# Patient Record
Sex: Female | Born: 1965 | Race: Black or African American | Hispanic: No | Marital: Single | Smoking: Never smoker
Health system: Southern US, Community
[De-identification: ages and names within clinical notes are randomized; demographics above are authoritative.]

## PROBLEM LIST (undated history)

## (undated) DIAGNOSIS — M1990 Unspecified osteoarthritis, unspecified site: Secondary | ICD-10-CM

## (undated) DIAGNOSIS — I10 Essential (primary) hypertension: Secondary | ICD-10-CM

## (undated) DIAGNOSIS — E119 Type 2 diabetes mellitus without complications: Secondary | ICD-10-CM

## (undated) DIAGNOSIS — F32A Depression, unspecified: Secondary | ICD-10-CM

## (undated) DIAGNOSIS — F419 Anxiety disorder, unspecified: Secondary | ICD-10-CM

---

## 2011-01-11 ENCOUNTER — Emergency Department (HOSPITAL_BASED_OUTPATIENT_CLINIC_OR_DEPARTMENT_OTHER)
Admission: EM | Admit: 2011-01-11 | Discharge: 2011-01-12 | Disposition: A | Discharge status: Home / Self Care | Payer: PRIVATE HEALTH INSURANCE | Attending: Emergency Medicine | Admitting: Emergency Medicine

## 2011-01-11 DIAGNOSIS — R112 Nausea with vomiting, unspecified: Secondary | ICD-10-CM | POA: Insufficient documentation

## 2011-01-11 DIAGNOSIS — G8929 Other chronic pain: Secondary | ICD-10-CM | POA: Insufficient documentation

## 2011-01-11 DIAGNOSIS — K5289 Other specified noninfective gastroenteritis and colitis: Secondary | ICD-10-CM | POA: Insufficient documentation

## 2011-01-11 LAB — DIFFERENTIAL
Basophils Absolute: 0 10*3/uL (ref 0.0–0.1)
Eosinophils Relative: 0 % (ref 0–5)
Lymphs Abs: 1.5 10*3/uL (ref 0.7–4.0)
Monocytes Absolute: 0.8 10*3/uL (ref 0.1–1.0)
Neutrophils Relative %: 77 % (ref 43–77)

## 2011-01-11 LAB — CBC
MCH: 27.9 pg (ref 26.0–34.0)
MCHC: 32.8 g/dL (ref 30.0–36.0)
MCV: 85.2 fL (ref 78.0–100.0)
Platelets: 228 10*3/uL (ref 150–400)
RBC: 4.26 MIL/uL (ref 3.87–5.11)
RDW: 13.5 % (ref 11.5–15.5)

## 2011-01-11 LAB — COMPREHENSIVE METABOLIC PANEL
AST: 37 U/L (ref 0–37)
Albumin: 3.8 g/dL (ref 3.5–5.2)
BUN: 10 mg/dL (ref 6–23)
Calcium: 9.6 mg/dL (ref 8.4–10.5)
Chloride: 96 mEq/L (ref 96–112)
Creatinine, Ser: 0.6 mg/dL (ref 0.4–1.2)
Total Protein: 7.7 g/dL (ref 6.0–8.3)

## 2011-01-11 LAB — URINALYSIS, ROUTINE W REFLEX MICROSCOPIC
Glucose, UA: NEGATIVE mg/dL
Ketones, ur: NEGATIVE mg/dL
Leukocytes, UA: NEGATIVE
Nitrite: NEGATIVE
Specific Gravity, Urine: 1.022 (ref 1.005–1.030)
pH: 7 (ref 5.0–8.0)

## 2011-01-11 LAB — URINE MICROSCOPIC-ADD ON

## 2011-01-11 LAB — LIPASE, BLOOD: Lipase: 21 U/L (ref 11–59)

## 2011-01-11 LAB — PREGNANCY, URINE: Preg Test, Ur: NEGATIVE

## 2011-01-13 LAB — URINE CULTURE: Culture: NO GROWTH

## 2020-01-19 DIAGNOSIS — G4733 Obstructive sleep apnea (adult) (pediatric): Secondary | ICD-10-CM | POA: Insufficient documentation

## 2020-01-19 DIAGNOSIS — R002 Palpitations: Secondary | ICD-10-CM | POA: Insufficient documentation

## 2020-01-19 DIAGNOSIS — R609 Edema, unspecified: Secondary | ICD-10-CM | POA: Insufficient documentation

## 2021-02-13 ENCOUNTER — Emergency Department (HOSPITAL_BASED_OUTPATIENT_CLINIC_OR_DEPARTMENT_OTHER)
Admission: EM | Admit: 2021-02-13 | Discharge: 2021-02-14 | Disposition: A | Discharge status: Home / Self Care | Payer: No Typology Code available for payment source | Attending: Emergency Medicine | Admitting: Emergency Medicine

## 2021-02-13 ENCOUNTER — Other Ambulatory Visit: Payer: Self-pay

## 2021-02-13 ENCOUNTER — Encounter (HOSPITAL_BASED_OUTPATIENT_CLINIC_OR_DEPARTMENT_OTHER): Payer: Self-pay | Admitting: *Deleted

## 2021-02-13 DIAGNOSIS — I10 Essential (primary) hypertension: Secondary | ICD-10-CM | POA: Insufficient documentation

## 2021-02-13 DIAGNOSIS — Z7984 Long term (current) use of oral hypoglycemic drugs: Secondary | ICD-10-CM | POA: Diagnosis not present

## 2021-02-13 DIAGNOSIS — F419 Anxiety disorder, unspecified: Secondary | ICD-10-CM | POA: Diagnosis not present

## 2021-02-13 DIAGNOSIS — Z79899 Other long term (current) drug therapy: Secondary | ICD-10-CM | POA: Insufficient documentation

## 2021-02-13 DIAGNOSIS — E119 Type 2 diabetes mellitus without complications: Secondary | ICD-10-CM | POA: Diagnosis not present

## 2021-02-13 DIAGNOSIS — F13239 Sedative, hypnotic or anxiolytic dependence with withdrawal, unspecified: Secondary | ICD-10-CM | POA: Insufficient documentation

## 2021-02-13 DIAGNOSIS — R0602 Shortness of breath: Secondary | ICD-10-CM | POA: Diagnosis present

## 2021-02-13 DIAGNOSIS — F13939 Sedative, hypnotic or anxiolytic use, unspecified with withdrawal, unspecified: Secondary | ICD-10-CM

## 2021-02-13 HISTORY — DX: Depression, unspecified: F32.A

## 2021-02-13 HISTORY — DX: Essential (primary) hypertension: I10

## 2021-02-13 HISTORY — DX: Type 2 diabetes mellitus without complications: E11.9

## 2021-02-13 HISTORY — DX: Anxiety disorder, unspecified: F41.9

## 2021-02-13 NOTE — ED Triage Notes (Addendum)
Sob all day. O2 sats 100%. Pt is ambulatory. States she ran out of her Paxil and Ativan.

## 2021-02-14 MED ORDER — LORAZEPAM 1 MG PO TABS: 1.0000 mg | ORAL_TABLET | Freq: Three times a day (TID) | ORAL | 0 refills | Status: AC | PRN

## 2021-02-14 MED ORDER — PAROXETINE HCL 30 MG PO TABS: 30.0000 mg | ORAL_TABLET | Freq: Every day | ORAL | 0 refills | Status: DC

## 2021-02-14 MED ORDER — LORAZEPAM 1 MG PO TABS
1.0000 mg | ORAL_TABLET | Freq: Once | ORAL | Status: AC
  Administered 2021-02-14: 1 mg via ORAL
  Filled 2021-02-14: qty 1

## 2021-02-14 NOTE — ED Provider Notes (Signed)
[facility] Provider Note: Amy Dell, MD, FACEP  CSN: [id number] MRN: [id number] ARRIVAL: 02/13/21 at 1957 ROOM: MH10/MH10   CHIEF COMPLAINT  Shortness of Breath   HISTORY OF PRESENT ILLNESS  02/14/21 1:01 AM Amy Boyle is a 55 y.o. female who is been out of her Paxil and Ativan for about a week.  She usually takes Ativan about twice a day as needed for anxiety.  She is here with 1 day of shortness of breath.  Symptoms have been moderate.  She has not been wheezing and her symptoms were not improved with albuterol.  She states she feels anxious and somewhat agitated.  She has been pacing around a lot.  She has not had any cold symptoms.  She has not been coughing.   Past Medical History:  Diagnosis Date   Anxiety    Depression    Diabetes mellitus without complication (HCC)    Hypertension     History reviewed. No pertinent surgical history.  No family history on file.  Social History   Tobacco Use   Smoking status: Never   Smokeless tobacco: Never  Substance Use Topics   Alcohol use: Never   Drug use: Never    Prior to Admission medications   Medication Sig Start Date End Date Taking? Authorizing Provider  albuterol (VENTOLIN HFA) 108 (90 Base) MCG/ACT inhaler Inhale into the lungs. 04/29/19  Yes [provider]  Dulaglutide (TRULICITY) 1.5 MG/0.5ML SOPN Inject into the skin. 03/23/18  Yes [provider]  ferrous sulfate 325 (65 FE) MG tablet Take 1 tablet by mouth daily. 04/23/19  Yes [provider]  furosemide (LASIX) 40 MG tablet Take by mouth. 11/15/19  Yes [provider]  hydrochlorothiazide (HYDRODIURIL) 25 MG tablet Take 1 tablet by mouth daily. 04/08/18  Yes [provider]  LORazepam (ATIVAN) 1 MG tablet Take 1 tablet (1 mg total) by mouth 3 (three) times daily as needed for anxiety. 02/14/21  Yes Sheryle Vice, MD  metoprolol tartrate (LOPRESSOR) 25 MG tablet Take 1 tablet by mouth 2 (two)  times daily. 01/19/20  Yes [provider]  potassium chloride (KLOR-CON) 10 MEQ tablet Take by mouth. 11/15/19  Yes [provider]  topiramate (TOPAMAX) 100 MG tablet Take by mouth. 03/16/19  Yes [provider]  buPROPion (WELLBUTRIN XL) 300 MG 24 hr tablet Take by mouth.    [provider]  Oxycodone HCl 10 MG TABS Take by mouth.    [provider]  PARoxetine (PAXIL) 30 MG tablet Take 1 tablet (30 mg total) by mouth daily. 02/14/21   Kattia Selley, Jonny Ruiz, MD    Allergies Patient has no known allergies.   REVIEW OF SYSTEMS  Negative except as noted here or in the History of Present Illness.   PHYSICAL EXAMINATION  Initial Vital Signs Blood pressure (!) 150/76, pulse 84, temperature 98.8 F (37.1 C), temperature source Oral, resp. rate 20, height 5\' 6"  (1.676 m), weight (!) 172.4 kg, last menstrual period 01/22/2021, SpO2 100 %.  Examination General: Well-developed, high BMI female in no acute distress; appearance consistent with age of record HENT: normocephalic; atraumatic Eyes: Normal appearance Neck: supple Heart: regular rate and rhythm Lungs: clear to auscultation bilaterally Abdomen: soft; nondistended; nontender; bowel sounds present Extremities: No deformity; full range of motion Neurologic: Awake, alert and oriented; motor function intact in all extremities and symmetric; no facial droop Skin: Warm and dry Psychiatric: Anxious   RESULTS  Summary of this visit's results, reviewed  and interpreted by myself:   EKG Interpretation  Date/Time:    Ventricular Rate:    PR Interval:    QRS Duration:   QT Interval:    QTC Calculation:   R Axis:     Text Interpretation:          Laboratory Studies: No results found for this or any previous visit (from the past 24 hour(s)). Imaging Studies: No results found.  ED COURSE and MDM  Nursing notes, initial and subsequent vitals signs, including pulse oximetry, reviewed and  interpreted by myself.  Vitals:   02/13/21 2006 02/13/21 2054 02/13/21 2226 02/13/21 2356  BP: (!) 150/92  (!) 144/74 (!) 150/76  Pulse: 93 73 79 84  Resp: 20 12 20 20   Temp: 98.8 F (37.1 C)     TempSrc: Oral     SpO2: 97% 100% 100% 100%  Weight:      Height:       Medications  LORazepam (ATIVAN) tablet 1 mg (has no administration in time range)   We will refill the patient's Paxil and partially refill the patient's Ativan pending follow-up with her PCP.  She was advised that we do not routinely prescribe benzodiazepines in the ED.   PROCEDURES  Procedures   ED DIAGNOSES     ICD-10-CM   1. Anxiety  F41.9     2. 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

## 2021-02-24 ENCOUNTER — Encounter (HOSPITAL_BASED_OUTPATIENT_CLINIC_OR_DEPARTMENT_OTHER): Payer: Self-pay | Admitting: Emergency Medicine

## 2021-02-24 ENCOUNTER — Other Ambulatory Visit: Payer: Self-pay

## 2021-02-24 ENCOUNTER — Emergency Department (HOSPITAL_BASED_OUTPATIENT_CLINIC_OR_DEPARTMENT_OTHER): Payer: PRIVATE HEALTH INSURANCE

## 2021-02-24 ENCOUNTER — Emergency Department (HOSPITAL_BASED_OUTPATIENT_CLINIC_OR_DEPARTMENT_OTHER)
Admission: EM | Admit: 2021-02-24 | Discharge: 2021-02-24 | Disposition: A | Discharge status: Home / Self Care | Payer: PRIVATE HEALTH INSURANCE | Attending: Emergency Medicine | Admitting: Emergency Medicine

## 2021-02-24 ENCOUNTER — Emergency Department (HOSPITAL_COMMUNITY): Payer: PRIVATE HEALTH INSURANCE

## 2021-02-24 DIAGNOSIS — R079 Chest pain, unspecified: Secondary | ICD-10-CM | POA: Insufficient documentation

## 2021-02-24 DIAGNOSIS — I10 Essential (primary) hypertension: Secondary | ICD-10-CM | POA: Insufficient documentation

## 2021-02-24 DIAGNOSIS — R112 Nausea with vomiting, unspecified: Secondary | ICD-10-CM | POA: Diagnosis not present

## 2021-02-24 DIAGNOSIS — Z20822 Contact with and (suspected) exposure to covid-19: Secondary | ICD-10-CM | POA: Insufficient documentation

## 2021-02-24 DIAGNOSIS — Z79899 Other long term (current) drug therapy: Secondary | ICD-10-CM | POA: Insufficient documentation

## 2021-02-24 DIAGNOSIS — R197 Diarrhea, unspecified: Secondary | ICD-10-CM | POA: Diagnosis not present

## 2021-02-24 DIAGNOSIS — R0602 Shortness of breath: Secondary | ICD-10-CM | POA: Insufficient documentation

## 2021-02-24 DIAGNOSIS — R1012 Left upper quadrant pain: Secondary | ICD-10-CM | POA: Insufficient documentation

## 2021-02-24 DIAGNOSIS — Z794 Long term (current) use of insulin: Secondary | ICD-10-CM | POA: Insufficient documentation

## 2021-02-24 DIAGNOSIS — E119 Type 2 diabetes mellitus without complications: Secondary | ICD-10-CM | POA: Insufficient documentation

## 2021-02-24 DIAGNOSIS — R1010 Upper abdominal pain, unspecified: Secondary | ICD-10-CM

## 2021-02-24 LAB — TROPONIN I (HIGH SENSITIVITY)
Troponin I (High Sensitivity): 2 ng/L (ref <18)
Troponin I (High Sensitivity): 2 ng/L (ref <18)

## 2021-02-24 LAB — COMPREHENSIVE METABOLIC PANEL
ALT: 13 U/L (ref 0–44)
AST: 17 U/L (ref 15–41)
Albumin: 4 g/dL (ref 3.5–5.0)
Alkaline Phosphatase: 69 U/L (ref 38–126)
Anion gap: 15 (ref 5–15)
BUN: 8 mg/dL (ref 6–20)
CO2: 26 mmol/L (ref 22–32)
Calcium: 9.6 mg/dL (ref 8.9–10.3)
Chloride: 97 mmol/L — ABNORMAL LOW (ref 98–111)
Creatinine, Ser: 0.86 mg/dL (ref 0.44–1.00)
GFR, Estimated: >60 mL/min (ref >60)
Glucose, Bld: 158 mg/dL — ABNORMAL HIGH (ref 70–99)
Potassium: 3.1 mmol/L — ABNORMAL LOW (ref 3.5–5.1)
Sodium: 138 mmol/L (ref 135–145)
Total Bilirubin: 0.4 mg/dL (ref 0.3–1.2)
Total Protein: 8.6 g/dL — ABNORMAL HIGH (ref 6.5–8.1)

## 2021-02-24 LAB — CBC
HCT: 34.7 % — ABNORMAL LOW (ref 36.0–46.0)
Hemoglobin: 10.6 g/dL — ABNORMAL LOW (ref 12.0–15.0)
MCH: 24.3 pg — ABNORMAL LOW (ref 26.0–34.0)
MCHC: 30.5 g/dL (ref 30.0–36.0)
MCV: 79.6 fL — ABNORMAL LOW (ref 80.0–100.0)
Platelets: 352 10*3/uL (ref 150–400)
RBC: 4.36 MIL/uL (ref 3.87–5.11)
RDW: 16.2 % — ABNORMAL HIGH (ref 11.5–15.5)
WBC: 11.8 10*3/uL — ABNORMAL HIGH (ref 4.0–10.5)
nRBC: 0 % (ref 0.0–0.2)

## 2021-02-24 LAB — RESP PANEL BY RT-PCR (FLU A&B, COVID) ARPGX2
Influenza A by PCR: NEGATIVE
Influenza B by PCR: NEGATIVE
SARS Coronavirus 2 by RT PCR: NEGATIVE

## 2021-02-24 LAB — LACTIC ACID, PLASMA: Lactic Acid, Venous: 1.4 mmol/L (ref 0.5–1.9)

## 2021-02-24 LAB — LIPASE, BLOOD: Lipase: 23 U/L (ref 11–51)

## 2021-02-24 MED ORDER — ONDANSETRON HCL 4 MG/2ML IJ SOLN
4.0000 mg | Freq: Once | INTRAMUSCULAR | Status: AC
  Administered 2021-02-24: 4 mg via INTRAVENOUS
  Filled 2021-02-24: qty 2

## 2021-02-24 MED ORDER — HYDROMORPHONE HCL 1 MG/ML IJ SOLN
1.0000 mg | Freq: Once | INTRAMUSCULAR | Status: AC
  Administered 2021-02-24: 1 mg via INTRAVENOUS
  Filled 2021-02-24: qty 1

## 2021-02-24 MED ORDER — ONDANSETRON HCL 4 MG PO TABS: 4.0000 mg | ORAL_TABLET | Freq: Four times a day (QID) | ORAL | 0 refills | Status: DC

## 2021-02-24 MED ORDER — ALUM & MAG HYDROXIDE-SIMETH 200-200-20 MG/5ML PO SUSP
30.0000 mL | Freq: Once | ORAL | Status: AC
  Administered 2021-02-24: 30 mL via ORAL
  Filled 2021-02-24: qty 30

## 2021-02-24 MED ORDER — LIDOCAINE VISCOUS HCL 2 % MT SOLN
15.0000 mL | Freq: Once | OROMUCOSAL | Status: AC
  Administered 2021-02-24: 15 mL via ORAL
  Filled 2021-02-24: qty 15

## 2021-02-24 MED ORDER — HYDROMORPHONE HCL 1 MG/ML IJ SOLN
1.0000 mg | Freq: Once | INTRAMUSCULAR | Status: AC
Start: 2021-02-24 — End: 2021-02-24
  Administered 2021-02-24: 1 mg via INTRAVENOUS
  Filled 2021-02-24: qty 1

## 2021-02-24 MED ORDER — FAMOTIDINE IN NACL 20-0.9 MG/50ML-% IV SOLN
20.0000 mg | Freq: Once | INTRAVENOUS | Status: AC
  Administered 2021-02-24: 20 mg via INTRAVENOUS
  Filled 2021-02-24: qty 50

## 2021-02-24 MED ORDER — IOHEXOL 350 MG/ML SOLN
80.0000 mL | Freq: Once | INTRAVENOUS | Status: AC | PRN
  Administered 2021-02-24: 80 mL via INTRAVENOUS

## 2021-02-24 MED ORDER — SODIUM CHLORIDE 0.9 % IV BOLUS
500.0000 mL | Freq: Once | INTRAVENOUS | Status: AC
  Administered 2021-02-24: 500 mL via INTRAVENOUS

## 2021-02-24 MED ORDER — SODIUM CHLORIDE 0.9 % IV SOLN: Freq: Once | INTRAVENOUS | Status: AC

## 2021-02-24 NOTE — ED Provider Notes (Addendum)
Patient was transferred to [facility] ED from [facility] since CT scanner was unavailable at [facility].  CT imaging obtained today was without significant abnormality.  Patient now feels significantly improved after treatment both here at [facility] and also [facility].  Repeat abdominal exam is benign.  Patient is taking good p.o.  She now desires discharge home.  Importance of close follow-up is stressed.  Strict return precautions given and understood.   Wynetta Fines, MD 02/24/21 1359    Wynetta Fines, MD 02/24/21 [phone]

## 2021-02-24 NOTE — ED Notes (Signed)
Pt, from [facility], c/o LUQ abdominal pain.  Per [facility], family reported chronic pain management and Pt ran out of medications.

## 2021-02-24 NOTE — ED Provider Notes (Signed)
[facility] EMERGENCY DEPARTMENT Provider Note   CSN: [id number] Arrival date & time: 02/24/21  0740     History Chief Complaint  Patient presents with   Abdominal Pain    Amy Boyle is a 55 y.o. female.  The history is provided by the patient.  Abdominal Pain Pain location:  LUQ Pain quality: sharp   Pain radiates to:  Does not radiate Pain severity:  Moderate Onset quality:  Gradual Duration:  1 day Timing:  Constant Progression:  Worsening Chronicity:  New Context: not sick contacts and not trauma   Relieved by:  Nothing Worsened by:  Nothing Ineffective treatments:  None tried Associated symptoms: chest pain, diarrhea, nausea, shortness of breath and vomiting   Associated symptoms: no chills, no cough, no dysuria, no fever, no hematuria and no sore throat       Past Medical History:  Diagnosis Date   Anxiety    Depression    Diabetes mellitus without complication (HCC)    Hypertension     There are no problems to display for this patient.   History reviewed. No pertinent surgical history.   OB History   No obstetric history on file.     No family history on file.  Social History   Tobacco Use   Smoking status: Never   Smokeless tobacco: Never  Substance Use Topics   Alcohol use: Never   Drug use: Never    Home Medications Prior to Admission medications   Medication Sig Start Date End Date Taking? Authorizing Provider  albuterol (VENTOLIN HFA) 108 (90 Base) MCG/ACT inhaler Inhale into the lungs. 04/29/19   [provider]  buPROPion (WELLBUTRIN XL) 300 MG 24 hr tablet Take by mouth.    [provider]  Dulaglutide (TRULICITY) 1.5 MG/0.5ML SOPN Inject into the skin. 03/23/18   [provider]  ferrous sulfate 325 (65 FE) MG tablet Take 1 tablet by mouth daily. 04/23/19   [provider]  furosemide (LASIX) 40 MG tablet Take by mouth. 11/15/19   [provider]  hydrochlorothiazide  (HYDRODIURIL) 25 MG tablet Take 1 tablet by mouth daily. 04/08/18   [provider]  LORazepam (ATIVAN) 1 MG tablet Take 1 tablet (1 mg total) by mouth 3 (three) times daily as needed for anxiety. 02/14/21   Molpus, John, MD  metoprolol tartrate (LOPRESSOR) 25 MG tablet Take 1 tablet by mouth 2 (two) times daily. 01/19/20   [provider]  Oxycodone HCl 10 MG TABS Take by mouth.    [provider]  PARoxetine (PAXIL) 30 MG tablet Take 1 tablet (30 mg total) by mouth daily. 02/14/21   Molpus, John, MD  potassium chloride (KLOR-CON) 10 MEQ tablet Take by mouth. 11/15/19   [provider]  topiramate (TOPAMAX) 100 MG tablet Take by mouth. 03/16/19   [provider]    Allergies    Patient has no known allergies.  Review of Systems   Review of Systems  Constitutional:  Negative for chills and fever.  HENT:  Negative for ear pain and sore throat.   Eyes:  Negative for pain and visual disturbance.  Respiratory:  Positive for shortness of breath. Negative for cough.   Cardiovascular:  Positive for chest pain. Negative for palpitations.  Gastrointestinal:  Positive for abdominal pain, diarrhea, nausea and vomiting.  Genitourinary:  Negative for dysuria and hematuria.  Musculoskeletal:  Negative for arthralgias and back pain.  Skin:  Negative for color change and rash.  Neurological:  Negative for seizures and syncope.  All other systems reviewed and are negative.  Physical Exam Updated Vital Signs BP (!) 158/98 (BP Location: Right Wrist)   Pulse 66   Temp 98.7 F (37.1 C) (Oral)   Resp (!) 22   SpO2 100%   Physical Exam Vitals and nursing note reviewed.  Constitutional:      Appearance: She is well-developed.  HENT:     Head: Normocephalic and atraumatic.  Cardiovascular:     Rate and Rhythm: Normal rate and regular rhythm.     Heart sounds: Normal heart sounds.  Pulmonary:     Effort: Pulmonary effort is normal. No tachypnea.     Breath  sounds: Normal breath sounds.  Abdominal:     General: Bowel sounds are normal.     Palpations: Abdomen is soft.     Tenderness: There is abdominal tenderness in the epigastric area and left upper quadrant.  Musculoskeletal:     Right lower leg: No edema.     Left lower leg: No edema.  Skin:    General: Skin is warm and dry.  Neurological:     General: No focal deficit present.     Mental Status: She is alert and oriented to person, place, and time.  Psychiatric:        Mood and Affect: Mood normal.        Behavior: Behavior normal.    ED Results / Procedures / Treatments   Labs (all labs ordered are listed, but only abnormal results are displayed) Labs Reviewed  COMPREHENSIVE METABOLIC PANEL - Abnormal; Notable for the following components:      Result Value   Potassium 3.1 (*)    Chloride 97 (*)    Glucose, Bld 158 (*)    Total Protein 8.6 (*)    All other components within normal limits  CBC - Abnormal; Notable for the following components:   WBC 11.8 (*)    Hemoglobin 10.6 (*)    HCT 34.7 (*)    MCV 79.6 (*)    MCH 24.3 (*)    RDW 16.2 (*)    All other components within normal limits  RESP PANEL BY RT-PCR (FLU A&B, COVID) ARPGX2  LIPASE, BLOOD  URINALYSIS, ROUTINE W REFLEX MICROSCOPIC  TROPONIN I (HIGH SENSITIVITY)  TROPONIN I (HIGH SENSITIVITY)    EKG None  Radiology No results found.  Procedures Procedures   Medications Ordered in ED Medications  sodium chloride 0.9 % bolus 500 mL (has no administration in time range)  HYDROmorphone (DILAUDID) injection 1 mg (has no administration in time range)  ondansetron (ZOFRAN) injection 4 mg (4 mg Intravenous Given 02/24/21 0818)    ED Course  I have reviewed the triage vital signs and the nursing notes.  Pertinent labs & imaging results that were available during my care of the patient were reviewed by me and considered in my medical decision making (see chart for details).    MDM Rules/Calculators/A&P                            Amy Boyle presents to the emergency department complaining of chest pain, shortness of breath, nausea, upper abdominal pain, and vomiting.  Differential diagnosis includes pneumonia, ACS, pancreatitis, biliary pathology, bowel obstruction.  Less likely would be urologic pathology.  Her white count is slightly elevated, hemoglobin is approximately one-point lower than a year ago.  She will be transferred to [facility] long hospital.  Unfortunately, there is no CT scan available today at [facility]. Final Clinical Impression(s) / ED Diagnoses Final diagnoses:  Pain of upper abdomen  Diarrhea, unspecified type    Rx / DC Orders ED Discharge Orders     None        Koleen Distance, MD 02/24/21 1015

## 2021-02-24 NOTE — ED Notes (Signed)
Report given to Barry Brunner, Press photographer at [facility] ED

## 2021-02-24 NOTE — ED Notes (Signed)
Verbalized understanding discharge instructions, prescriptions, and follow-up. In no acute distress.   

## 2021-02-24 NOTE — ED Notes (Signed)
SPO2 in lobby 97%, HR 75, unable to walk, taken to room 9 in WC.

## 2021-02-24 NOTE — ED Notes (Signed)
Called Patient's daughter, Shona Needles [phone] and updated her on patient's treatment so far and need to transfer her to [facility] ED.

## 2021-02-24 NOTE — ED Triage Notes (Signed)
Pt reports abdominal pain with emesis. PT also reports SHOB x 2 weeks. Per daughter pt takes "maybe 5-6 percocet's per day and she has ran out." Pt reports last taking her percocet 7/21.

## 2021-02-24 NOTE — ED Notes (Signed)
Patient transported to CT 

## 2021-08-13 ENCOUNTER — Encounter (HOSPITAL_BASED_OUTPATIENT_CLINIC_OR_DEPARTMENT_OTHER): Payer: Self-pay | Admitting: *Deleted

## 2021-08-13 ENCOUNTER — Emergency Department (HOSPITAL_BASED_OUTPATIENT_CLINIC_OR_DEPARTMENT_OTHER): Payer: No Typology Code available for payment source

## 2021-08-13 ENCOUNTER — Other Ambulatory Visit: Payer: Self-pay

## 2021-08-13 ENCOUNTER — Emergency Department (HOSPITAL_BASED_OUTPATIENT_CLINIC_OR_DEPARTMENT_OTHER)
Admission: EM | Admit: 2021-08-13 | Discharge: 2021-08-14 | Disposition: A | Discharge status: Home / Self Care | Payer: No Typology Code available for payment source | Attending: Emergency Medicine | Admitting: Emergency Medicine

## 2021-08-13 DIAGNOSIS — R1084 Generalized abdominal pain: Secondary | ICD-10-CM | POA: Diagnosis present

## 2021-08-13 DIAGNOSIS — Z79899 Other long term (current) drug therapy: Secondary | ICD-10-CM | POA: Insufficient documentation

## 2021-08-13 DIAGNOSIS — E119 Type 2 diabetes mellitus without complications: Secondary | ICD-10-CM | POA: Diagnosis not present

## 2021-08-13 DIAGNOSIS — R197 Diarrhea, unspecified: Secondary | ICD-10-CM | POA: Insufficient documentation

## 2021-08-13 DIAGNOSIS — D72829 Elevated white blood cell count, unspecified: Secondary | ICD-10-CM | POA: Insufficient documentation

## 2021-08-13 DIAGNOSIS — R112 Nausea with vomiting, unspecified: Secondary | ICD-10-CM | POA: Insufficient documentation

## 2021-08-13 DIAGNOSIS — R109 Unspecified abdominal pain: Secondary | ICD-10-CM

## 2021-08-13 DIAGNOSIS — Z794 Long term (current) use of insulin: Secondary | ICD-10-CM | POA: Insufficient documentation

## 2021-08-13 DIAGNOSIS — I10 Essential (primary) hypertension: Secondary | ICD-10-CM | POA: Insufficient documentation

## 2021-08-13 LAB — CBG MONITORING, ED: Glucose-Capillary: 156 mg/dL — ABNORMAL HIGH (ref 70–99)

## 2021-08-13 MED ORDER — LACTATED RINGERS IV BOLUS
1000.0000 mL | Freq: Once | INTRAVENOUS | Status: AC
  Administered 2021-08-13: 1000 mL via INTRAVENOUS

## 2021-08-13 MED ORDER — MORPHINE SULFATE (PF) 4 MG/ML IV SOLN
4.0000 mg | Freq: Once | INTRAVENOUS | Status: AC
Start: 2021-08-13 — End: 2021-08-13
  Administered 2021-08-13: 4 mg via INTRAVENOUS
  Filled 2021-08-13: qty 1

## 2021-08-13 MED ORDER — METOCLOPRAMIDE HCL 5 MG/ML IJ SOLN
10.0000 mg | Freq: Once | INTRAMUSCULAR | Status: AC
  Administered 2021-08-13: 10 mg via INTRAVENOUS
  Filled 2021-08-13: qty 2

## 2021-08-13 MED ORDER — MORPHINE SULFATE (PF) 4 MG/ML IV SOLN
4.0000 mg | Freq: Once | INTRAVENOUS | Status: AC
  Administered 2021-08-13: 4 mg via INTRAVENOUS
  Filled 2021-08-13: qty 1

## 2021-08-13 MED ORDER — IOHEXOL 300 MG/ML  SOLN
100.0000 mL | Freq: Once | INTRAMUSCULAR | Status: AC | PRN
  Administered 2021-08-13: 125 mL via INTRAVENOUS

## 2021-08-13 MED ORDER — ONDANSETRON HCL 4 MG/2ML IJ SOLN
4.0000 mg | Freq: Once | INTRAMUSCULAR | Status: AC
Start: 2021-08-13 — End: 2021-08-13
  Administered 2021-08-13: 4 mg via INTRAVENOUS
  Filled 2021-08-13: qty 2

## 2021-08-13 NOTE — ED Notes (Signed)
Patient transported to CT 

## 2021-08-13 NOTE — ED Notes (Addendum)
Unable to obtain labs x 2 sticks.  Pt was just seen at [facility] and had labs done there.  Pt also states she is unable to void

## 2021-08-13 NOTE — ED Notes (Signed)
ED Provider at bedside. 

## 2021-08-13 NOTE — ED Triage Notes (Signed)
Abdominal pain, vomiting and diarrhea since yesterday

## 2021-08-13 NOTE — ED Notes (Signed)
Patient returned from CT

## 2021-08-13 NOTE — ED Provider Notes (Signed)
[facility] EMERGENCY DEPARTMENT Provider Note   CSN: [id number] Arrival date & time: 08/13/21  1559     History  Chief Complaint  Patient presents with   Abdominal Pain    DAYTON STEPHNEY is a 56 y.o. female.  Patient is a 56 year old female with a history of diabetes, hypertension, anxiety, depression who is presenting today with complaint of nausea vomiting and diarrhea.  Patient reports that her symptoms started yesterday.  She has had diffuse abdominal cramping, vomiting and diarrhea.  She has not noticed any fever, cough but chronically has shortness of breath.  Patient does not take any medication at home for nausea or vomiting.  She denies prior abdominal surgeries.  Patient did go to [facility] regional today and had lab work drawn but was never seen or given any medication.  She reports she was still waiting and she did not want a wait any longer and came here instead.  She denies any change in her medications.  She reports this happens to her every few months but does not know why.  The history is provided by the patient and medical records.  Abdominal Pain Pain location:  Generalized Pain quality: aching and cramping   Pain severity:  Moderate Onset quality:  Gradual Duration:  2 days Timing:  Constant Progression:  Waxing and waning Chronicity:  Recurrent Context: not recent illness, not sick contacts and not suspicious food intake       Home Medications Prior to Admission medications   Medication Sig Start Date End Date Taking? Authorizing Provider  ondansetron (ZOFRAN-ODT) 8 MG disintegrating tablet 8mg  ODT q4 hours prn nausea 08/14/21  Yes Delo, Nathaneil Canary, MD  albuterol (VENTOLIN HFA) 108 (90 Base) MCG/ACT inhaler Inhale into the lungs. 04/29/19   [provider]  buPROPion (WELLBUTRIN XL) 300 MG 24 hr tablet Take by mouth.    [provider]  Dulaglutide (TRULICITY) 1.5 0000000 SOPN Inject into the skin. 03/23/18   [provider]  ferrous sulfate 325 (65 FE) MG tablet Take 1 tablet by mouth daily. 04/23/19   [provider]  furosemide (LASIX) 40 MG tablet Take by mouth. 11/15/19   [provider]  hydrochlorothiazide (HYDRODIURIL) 25 MG tablet Take 1 tablet by mouth daily. 04/08/18   [provider]  LORazepam (ATIVAN) 1 MG tablet Take 1 tablet (1 mg total) by mouth 3 (three) times daily as needed for anxiety. 02/14/21   Molpus, John, MD  metoprolol tartrate (LOPRESSOR) 25 MG tablet Take 1 tablet by mouth 2 (two) times daily. 01/19/20   [provider]  ondansetron (ZOFRAN) 4 MG tablet Take 1 tablet (4 mg total) by mouth every 6 (six) hours. 02/24/21   Valarie Merino, MD  Oxycodone HCl 10 MG TABS Take by mouth.    [provider]  PARoxetine (PAXIL) 30 MG tablet Take 1 tablet (30 mg total) by mouth daily. 02/14/21   Molpus, John, MD  potassium chloride (KLOR-CON) 10 MEQ tablet Take by mouth. 11/15/19   [provider]  topiramate (TOPAMAX) 100 MG tablet Take by mouth. 03/16/19   [provider]      Allergies    Patient has no known allergies.    Review of Systems   Review of Systems  Gastrointestinal:  Positive for abdominal pain.   Physical Exam Updated Vital Signs BP (!) 152/57    Pulse 62    Temp 98.1 F (36.7 C) (Oral)    Resp 16  Ht 5\' 6"  (1.676 m)    Wt (!) 172.4 kg    SpO2 97%    BMI 61.35 kg/m  Physical Exam Vitals and nursing note reviewed.  Constitutional:      General: She is not in acute distress.    Appearance: She is well-developed.  HENT:     Head: Normocephalic and atraumatic.     Nose: Nose normal.     Mouth/Throat:     Mouth: Mucous membranes are dry.  Eyes:     Pupils: Pupils are equal, round, and reactive to light.  Cardiovascular:     Rate and Rhythm: Normal rate and regular rhythm.     Heart sounds: Normal heart sounds. No murmur heard.   No friction rub.  Pulmonary:     Effort: Pulmonary effort is normal.      Breath sounds: Normal breath sounds. No wheezing or rales.  Abdominal:     General: Bowel sounds are normal. There is no distension.     Palpations: Abdomen is soft.     Tenderness: There is generalized abdominal tenderness. There is no right CVA tenderness, left CVA tenderness, guarding or rebound.  Musculoskeletal:        General: No tenderness. Normal range of motion.     Right lower leg: No edema.     Left lower leg: No edema.     Comments: No edema  Skin:    General: Skin is warm and dry.     Findings: No rash.  Neurological:     Mental Status: She is alert and oriented to person, place, and time. Mental status is at baseline.     Cranial Nerves: No cranial nerve deficit.  Psychiatric:        Mood and Affect: Mood normal.        Behavior: Behavior normal.    ED Results / Procedures / Treatments   Labs (all labs ordered are listed, but only abnormal results are displayed) Labs Reviewed  URINALYSIS, ROUTINE W REFLEX MICROSCOPIC - Abnormal; Notable for the following components:      Result Value   Hgb urine dipstick TRACE (*)    Ketones, ur 15 (*)    Protein, ur 30 (*)    All other components within normal limits  URINALYSIS, MICROSCOPIC (REFLEX) - Abnormal; Notable for the following components:   Bacteria, UA RARE (*)    All other components within normal limits  CBG MONITORING, ED - Abnormal; Notable for the following components:   Glucose-Capillary 156 (*)    All other components within normal limits    EKG None  Radiology No results found.  Procedures Procedures    Medications Ordered in ED Medications  morphine 4 MG/ML injection 4 mg (4 mg Intravenous Given 08/13/21 2113)  ondansetron (ZOFRAN) injection 4 mg (4 mg Intravenous Given 08/13/21 2112)  lactated ringers bolus 1,000 mL (0 mLs Intravenous Stopped 08/13/21 2214)  metoCLOPramide (REGLAN) injection 10 mg (10 mg Intravenous Given 08/13/21 2335)  morphine 4 MG/ML injection 4 mg (4 mg Intravenous Given  08/13/21 2338)  iohexol (OMNIPAQUE) 300 MG/ML solution 100 mL (125 mLs Intravenous Contrast Given 08/13/21 2244)    ED Course/ Medical Decision Making/ A&P                           Medical Decision Making Amount and/or Complexity of Data Reviewed External Data Reviewed: labs and notes. Labs: ordered. Decision-making details documented in ED Course.  Patient is a 56 year old female presenting today with abdominal pain nausea vomiting and diarrhea.  Patient has mild diffuse abdominal tenderness on exam but no guarding, rebound.  She was seen at [facility] regional earlier today based on my independent evaluation of her external records.  I personally evaluated her lab work that was done at [facility] and today she had a blood sugar of 154, normal creatinine of 0.76 and normal sodium of 139.  Potassium was slightly decreased at 3.2 which seems to be her baseline with normal LFTs and total bilirubin.  Patient's hemoglobin was stable at 11, normal lipase, BNP.  She had a mild leukocytosis of 13.  She had a chest x-ray done at [facility] regional today that showed subtle ill-defined opacity within the medial right lung base which was favored to be atelectasis.  She denies any cough and does have chronic shortness of breath but is satting 100% on room air and low suspicion for pneumonia at this time.  She also had an EKG done at [facility] which showed sinus bradycardia no acute changes from prior EKGs.  Concern for possible GI illness versus colitis versus gastroparesis.  Lower suspicion for diverticulitis, hepatitis, cholecystitis, pancreatitis today.  Low suspicion for atypical ACS.  We will treat patient with fluids, nausea and pain medication and reevaluate. I independently evaluated.  Patient's urine which is negative for UTI and blood sugar is 156.  On reevaluation patient was still having diffuse abdominal pain but no further vomiting.  We will do a CT to further evaluate.  Patient was  given further pain control.  Care was transferred to Dr. Stark Jock.        Final Clinical Impression(s) / ED Diagnoses Final diagnoses:  Acute abdominal pain    Rx / DC Orders ED Discharge Orders          Ordered    ondansetron (ZOFRAN-ODT) 8 MG disintegrating tablet        08/14/21 0147              Blanchie Dessert, MD 08/17/21 0725

## 2021-08-14 LAB — URINALYSIS, ROUTINE W REFLEX MICROSCOPIC
Bilirubin Urine: NEGATIVE
Glucose, UA: NEGATIVE mg/dL
Ketones, ur: 15 mg/dL — AB
Leukocytes,Ua: NEGATIVE
Nitrite: NEGATIVE
Protein, ur: 30 mg/dL — AB
Specific Gravity, Urine: 1.01 (ref 1.005–1.030)
pH: 6 (ref 5.0–8.0)

## 2021-08-14 LAB — URINALYSIS, MICROSCOPIC (REFLEX)

## 2021-08-14 MED ORDER — ONDANSETRON 8 MG PO TBDP: ORAL_TABLET | ORAL | 0 refills | Status: DC

## 2021-08-14 NOTE — Discharge Instructions (Signed)
Take Zofran as prescribed as needed for nausea.  Clear liquid diet for the next 12 hours, then slowly advance to normal as tolerated.  Return to the emergency department if you develop worsening pain, high fever, bloody stools, or other new and concerning symptoms.

## 2021-08-14 NOTE — ED Provider Notes (Signed)
°  Physical Exam  BP (!) 152/57    Pulse 62    Temp 98.1 F (36.7 C) (Oral)    Resp 16    Ht 5\' 6"  (1.676 m)    Wt (!) 172.4 kg    SpO2 97%    BMI 61.35 kg/m   Physical Exam Vitals and nursing note reviewed.  Constitutional:      General: She is not in acute distress.    Appearance: She is well-developed. She is not diaphoretic.  HENT:     Head: Normocephalic and atraumatic.  Cardiovascular:     Rate and Rhythm: Normal rate and regular rhythm.     Heart sounds: No murmur heard.   No friction rub. No gallop.  Pulmonary:     Effort: Pulmonary effort is normal. No respiratory distress.     Breath sounds: Normal breath sounds. No wheezing.  Abdominal:     General: Bowel sounds are normal. There is no distension.     Palpations: Abdomen is soft.     Tenderness: There is no abdominal tenderness.  Musculoskeletal:        General: Normal range of motion.     Cervical back: Normal range of motion and neck supple.  Skin:    General: Skin is warm and dry.  Neurological:     Mental Status: She is alert and oriented to person, place, and time.    Procedures  Procedures  ED Course / MDM    Medical Decision Making  Care assumed from Dr. at shift change.  Patient awaiting results of CT scan.  Patient originally presented to [facility] with abdominal pain and diarrhea, however left prior to being seen due to prolonged wait time.  She has a mild leukocytosis, but laboratory studies are otherwise unremarkable.  CT scan obtained here shows no acute process, but does show possible inflammation of the urinary bladder.  Urinalysis was obtained and does not reflect UTI.  I am uncertain as to the significance of this finding, but patient is not having any tenderness in this area.  I feel as though patient can safely be discharged.  She will be given Zofran and advised to follow-up as needed.  I have witnessed no vomiting or additional diarrhea since assuming care.       [facility], MD 08/14/21 [phone]

## 2022-07-19 IMAGING — DX DG CHEST 1V PORT
1 series · 1 of 1 positions shown · non-contrast
Comparison: November 08, 2019

CLINICAL DATA: Abdominal pain.  Emesis.  Shortness of breath.

EXAM:
PORTABLE CHEST 1 VIEW

[chest ap]
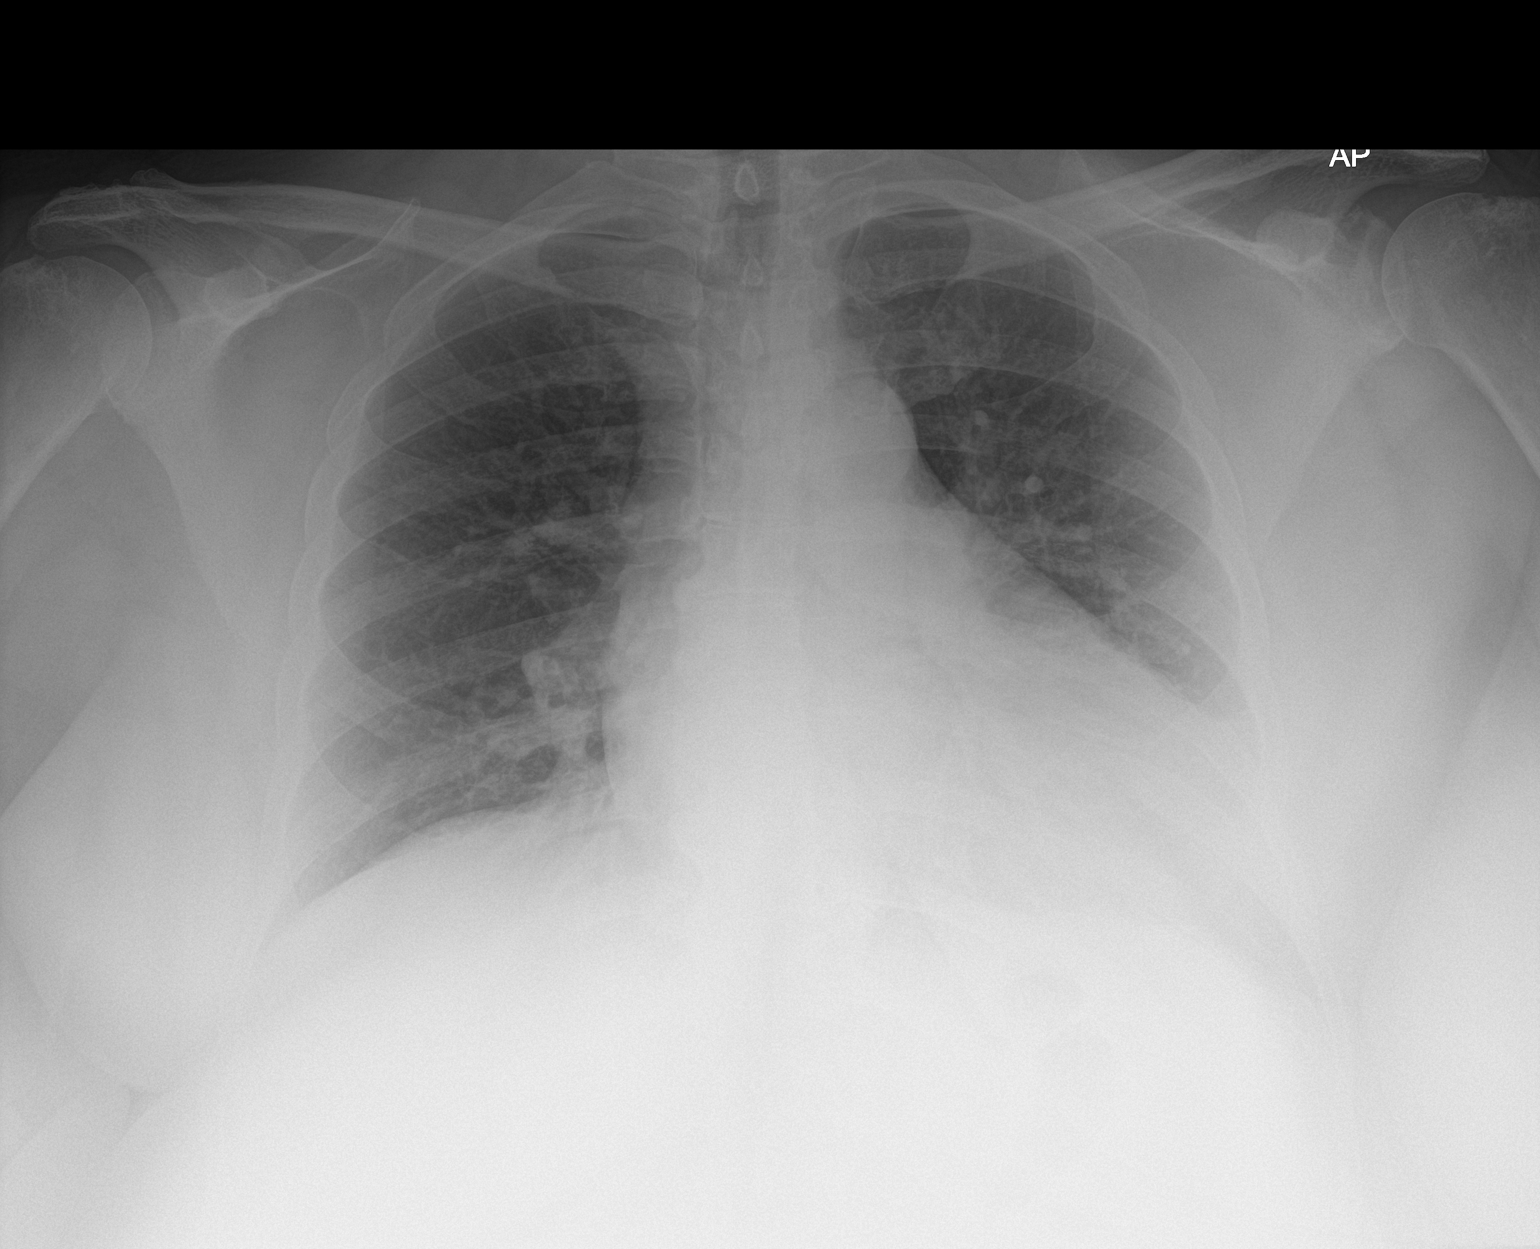

[1 of 1 positions shown; findings below may reference images not displayed]

FINDINGS: The study is limited due to patient body habitus and low volume
portable technique. Mild prominence of the cardiac silhouette. The
hila, mediastinum, lungs, and pleura are otherwise unremarkable.
IMPRESSION: No active disease.

## 2022-07-19 IMAGING — CT CT ABD-PELV W/ CM
2 of 5 series · 16 of 46 positions shown, 18 images · IV contrast (omnipaque)
Comparison: None.

CLINICAL DATA: Bowel obstruction suspected; mid and LEFT upper
quadrant abdominal pain

EXAM:
CT ABDOMEN AND PELVIS WITH CONTRAST
TECHNIQUE: Multidetector CT imaging of the abdomen and pelvis was performed
using the standard protocol following bolus administration of
intravenous contrast.
CONTRAST:  80mL OMNIPAQUE IOHEXOL 350 MG/ML SOLN

[Series 2: axial st · axial · 0.83mm/px · z∈[-447,-42]mm · 13 of 95 slices shown, 15 images]
[im 7/95  soft-tissue]
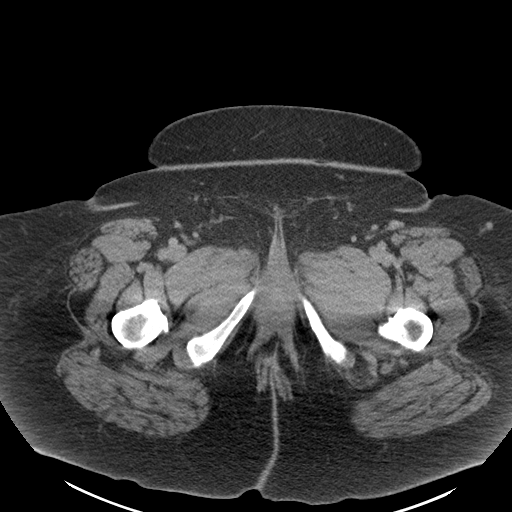
[im 7/95  bone]
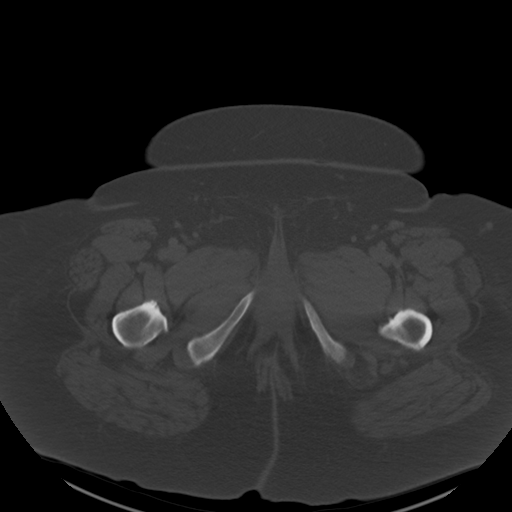
[im 14/95  soft-tissue]
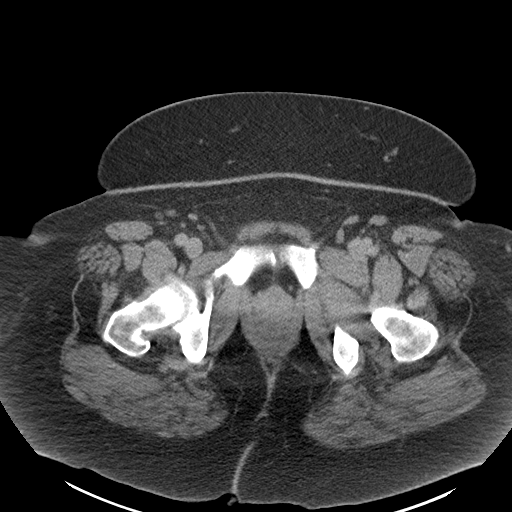
[im 21/95  soft-tissue]
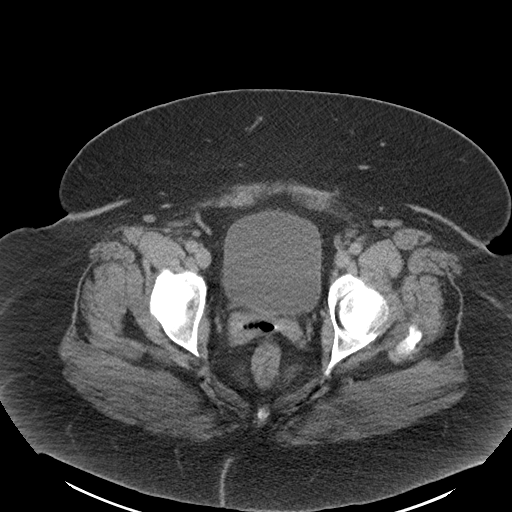
[im 27/95  soft-tissue]
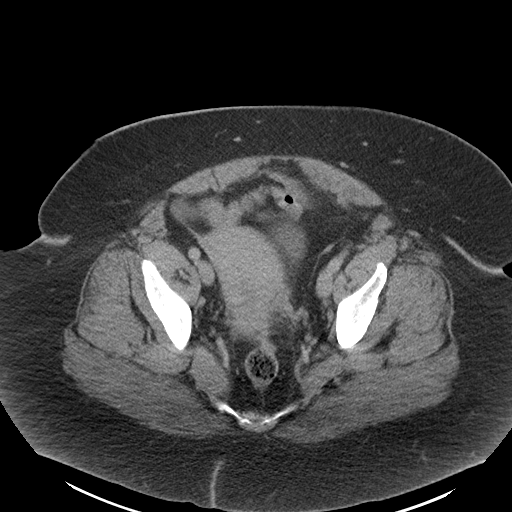
[im 34/95  soft-tissue]
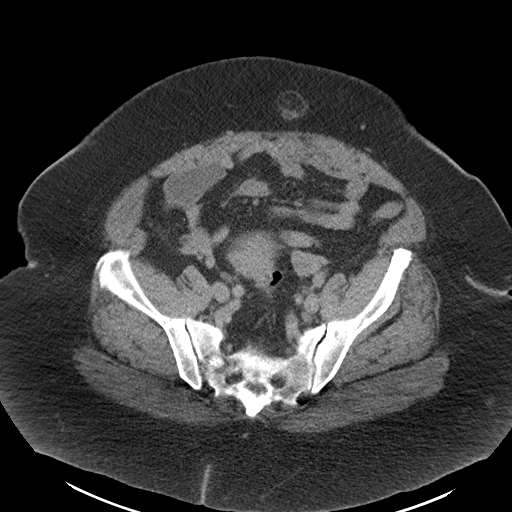
[im 41/95  soft-tissue]
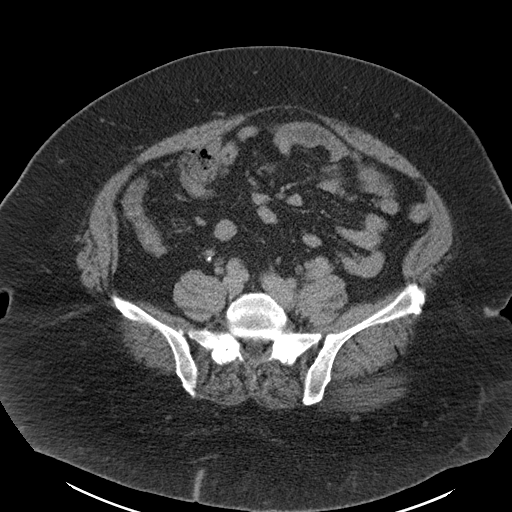
[im 48/95  soft-tissue]
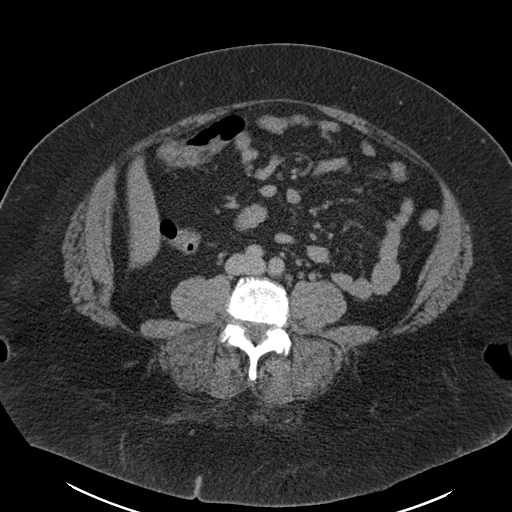
[im 54/95  soft-tissue]
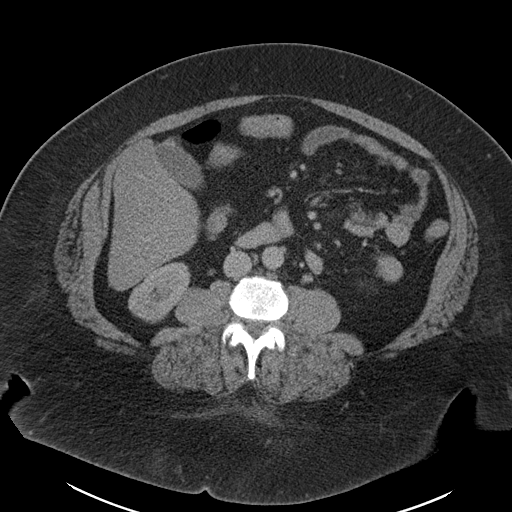
[im 61/95  soft-tissue]
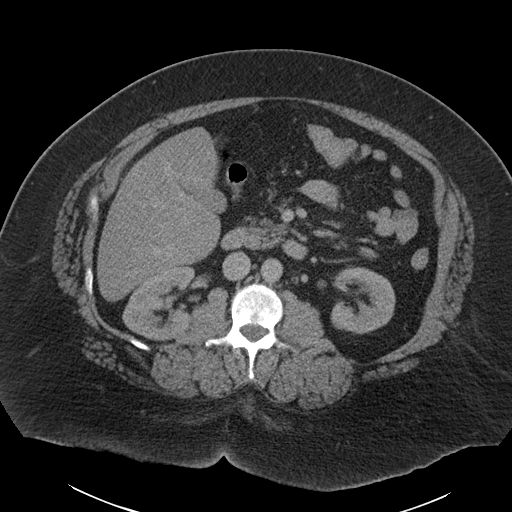
[im 61/95  bone]
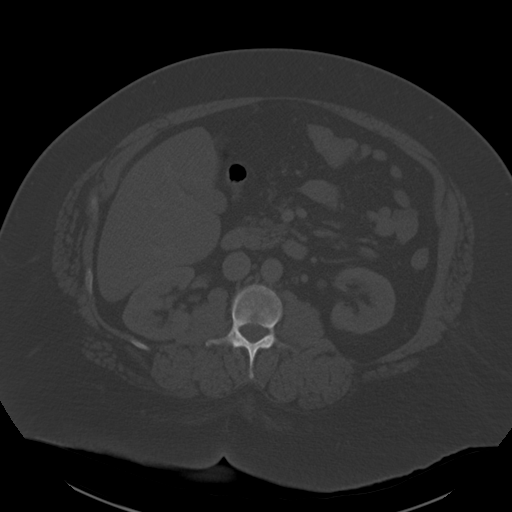
[im 68/95  soft-tissue]
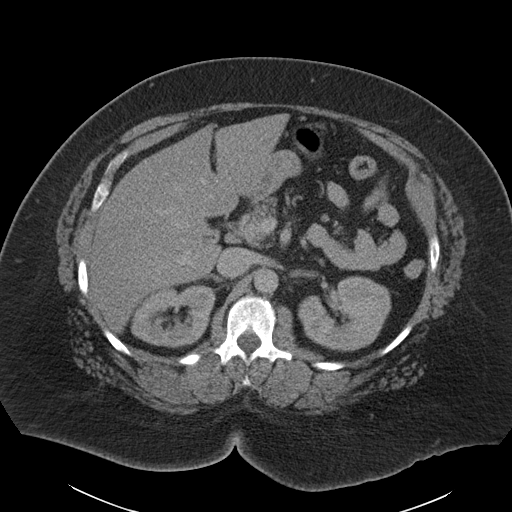
[im 74/95  soft-tissue]
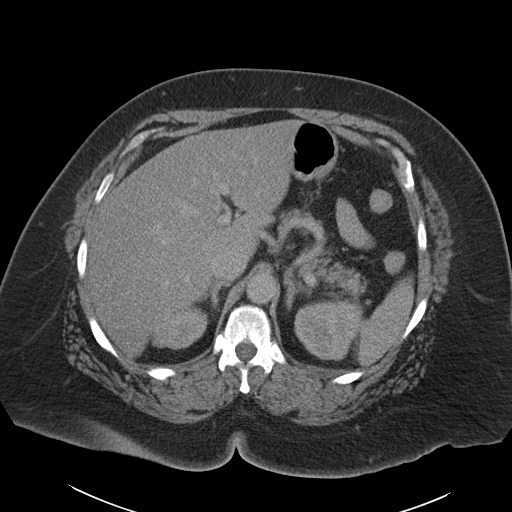
[im 81/95  soft-tissue]
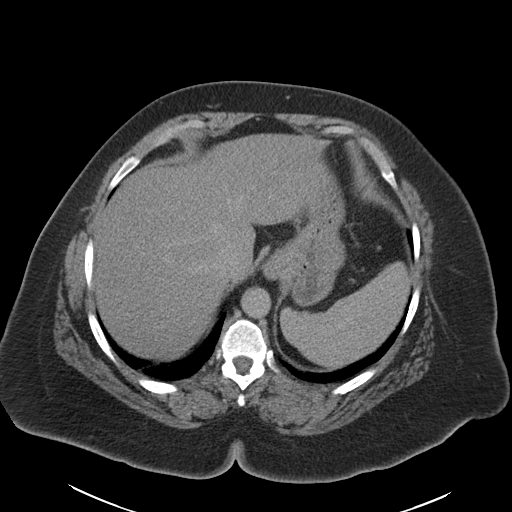
[im 88/95  soft-tissue]
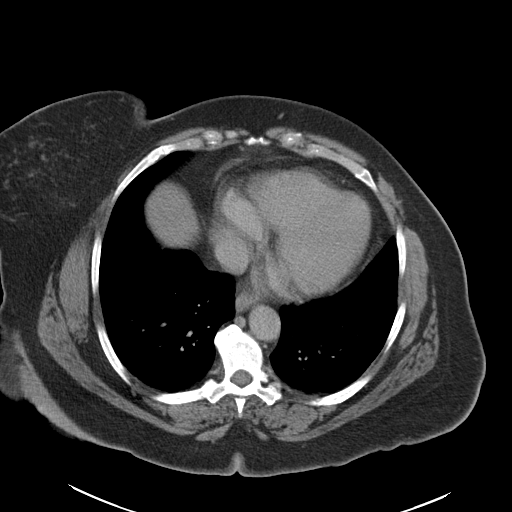

[Series 5: coronal st · coronal · 0.89mm/px · 3 of 191 slices shown]
[im 64/191  soft-tissue]
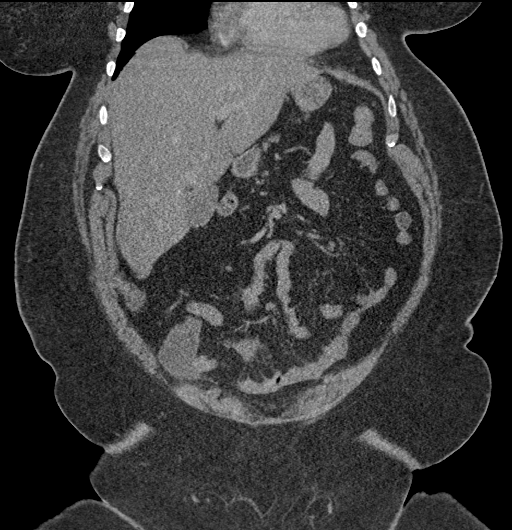
[im 85/191  soft-tissue]
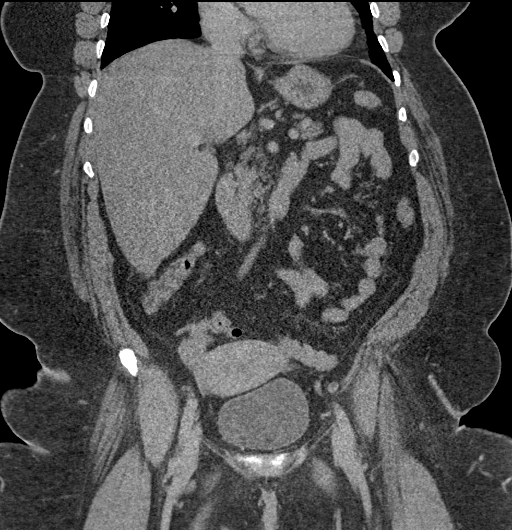
[im 106/191  soft-tissue]
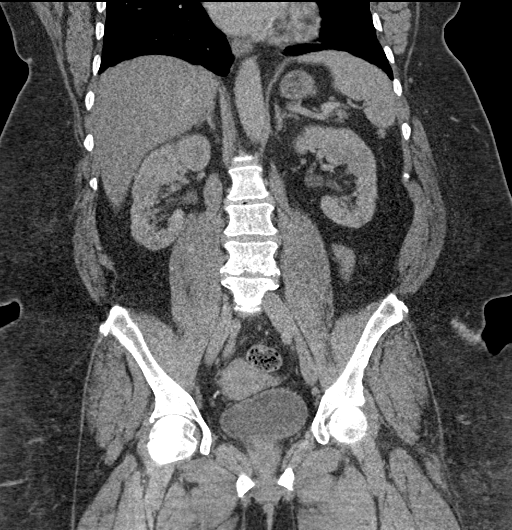

[16 of 46 positions shown; findings below may reference images not displayed]

FINDINGS: Lower chest: No acute abnormality.  Scattered bibasilar atelectasis.

Hepatobiliary: Hepatic steatosis. Focal fatty sparing adjacent to
the gallbladder. Gallbladder is unremarkable. No intrahepatic or
extrahepatic biliary ductal dilation. Portal vein is patent.

Pancreas: Unremarkable. No pancreatic ductal dilatation or
surrounding inflammatory changes.

Spleen: Normal in size without focal abnormality.

Adrenals/Urinary Tract: Adrenal glands are unremarkable. Kidneys are
normal, without renal calculi, focal lesion, or hydronephrosis.
Bladder is unremarkable.

Stomach/Bowel: No evidence of bowel obstruction. Appendix is within
the upper limits of normal in size at 5-6 mm. No ancillary evidence
of acute appendicitis. Small hiatal hernia

Vascular/Lymphatic: No significant vascular findings are present. No
suspicious enlarged abdominal or pelvic lymph nodes. Mildly
prominent RIGHT external iliac lymph node measures 1 cm in the short
axis (series 2, image 72).

Reproductive: Uterus and bilateral adnexa are unremarkable.

Other: Small fat containing paraumbilical hernia. No free air or
free fluid.

Musculoskeletal: Mild degenerative changes of the lumbar spine.
Mixed lucent sclerotic lesion of the RIGHT sacrum, likely a benign
fibro-osseous lesion.
IMPRESSION: 1. There is a small fat containing periumbilical hernia. Otherwise
no suspicious CT etiology for acute epigastric and LEFT upper
quadrant abdominal pain identified.
2. Hepatic steatosis.

## 2022-12-04 ENCOUNTER — Emergency Department (HOSPITAL_BASED_OUTPATIENT_CLINIC_OR_DEPARTMENT_OTHER): Payer: PRIVATE HEALTH INSURANCE

## 2022-12-04 ENCOUNTER — Inpatient Hospital Stay (HOSPITAL_BASED_OUTPATIENT_CLINIC_OR_DEPARTMENT_OTHER)
Admission: EM | Admit: 2022-12-04 | Discharge: 2022-12-07 | DRG: 391 | Disposition: A | Discharge status: Home / Self Care | Payer: PRIVATE HEALTH INSURANCE | Attending: Internal Medicine | Admitting: Internal Medicine

## 2022-12-04 ENCOUNTER — Encounter (HOSPITAL_BASED_OUTPATIENT_CLINIC_OR_DEPARTMENT_OTHER): Payer: Self-pay

## 2022-12-04 DIAGNOSIS — I110 Hypertensive heart disease with heart failure: Secondary | ICD-10-CM | POA: Diagnosis present

## 2022-12-04 DIAGNOSIS — E860 Dehydration: Secondary | ICD-10-CM | POA: Diagnosis present

## 2022-12-04 DIAGNOSIS — N950 Postmenopausal bleeding: Secondary | ICD-10-CM | POA: Diagnosis present

## 2022-12-04 DIAGNOSIS — K2900 Acute gastritis without bleeding: Principal | ICD-10-CM | POA: Diagnosis present

## 2022-12-04 DIAGNOSIS — E1165 Type 2 diabetes mellitus with hyperglycemia: Secondary | ICD-10-CM | POA: Diagnosis present

## 2022-12-04 DIAGNOSIS — R0609 Other forms of dyspnea: Secondary | ICD-10-CM

## 2022-12-04 DIAGNOSIS — R1084 Generalized abdominal pain: Secondary | ICD-10-CM | POA: Diagnosis present

## 2022-12-04 DIAGNOSIS — T383X6A Underdosing of insulin and oral hypoglycemic [antidiabetic] drugs, initial encounter: Secondary | ICD-10-CM | POA: Diagnosis present

## 2022-12-04 DIAGNOSIS — R001 Bradycardia, unspecified: Secondary | ICD-10-CM | POA: Diagnosis present

## 2022-12-04 DIAGNOSIS — F32A Depression, unspecified: Secondary | ICD-10-CM | POA: Diagnosis present

## 2022-12-04 DIAGNOSIS — Z91138 Patient's unintentional underdosing of medication regimen for other reason: Secondary | ICD-10-CM

## 2022-12-04 DIAGNOSIS — D638 Anemia in other chronic diseases classified elsewhere: Secondary | ICD-10-CM | POA: Diagnosis present

## 2022-12-04 DIAGNOSIS — R9389 Abnormal findings on diagnostic imaging of other specified body structures: Secondary | ICD-10-CM | POA: Diagnosis present

## 2022-12-04 DIAGNOSIS — R160 Hepatomegaly, not elsewhere classified: Secondary | ICD-10-CM | POA: Diagnosis present

## 2022-12-04 DIAGNOSIS — E662 Morbid (severe) obesity with alveolar hypoventilation: Secondary | ICD-10-CM | POA: Diagnosis present

## 2022-12-04 DIAGNOSIS — Z6841 Body Mass Index (BMI) 40.0 and over, adult: Secondary | ICD-10-CM

## 2022-12-04 DIAGNOSIS — R0902 Hypoxemia: Secondary | ICD-10-CM | POA: Diagnosis present

## 2022-12-04 DIAGNOSIS — Z7985 Long-term (current) use of injectable non-insulin antidiabetic drugs: Secondary | ICD-10-CM

## 2022-12-04 DIAGNOSIS — Z79899 Other long term (current) drug therapy: Secondary | ICD-10-CM

## 2022-12-04 DIAGNOSIS — R531 Weakness: Secondary | ICD-10-CM | POA: Diagnosis present

## 2022-12-04 DIAGNOSIS — E119 Type 2 diabetes mellitus without complications: Secondary | ICD-10-CM

## 2022-12-04 DIAGNOSIS — T501X6A Underdosing of loop [high-ceiling] diuretics, initial encounter: Secondary | ICD-10-CM | POA: Diagnosis present

## 2022-12-04 DIAGNOSIS — R109 Unspecified abdominal pain: Secondary | ICD-10-CM | POA: Diagnosis present

## 2022-12-04 DIAGNOSIS — I5033 Acute on chronic diastolic (congestive) heart failure: Secondary | ICD-10-CM | POA: Diagnosis not present

## 2022-12-04 DIAGNOSIS — R0600 Dyspnea, unspecified: Secondary | ICD-10-CM | POA: Diagnosis present

## 2022-12-04 DIAGNOSIS — R000 Tachycardia, unspecified: Secondary | ICD-10-CM | POA: Diagnosis present

## 2022-12-04 DIAGNOSIS — K760 Fatty (change of) liver, not elsewhere classified: Secondary | ICD-10-CM | POA: Diagnosis present

## 2022-12-04 DIAGNOSIS — I10 Essential (primary) hypertension: Secondary | ICD-10-CM | POA: Diagnosis present

## 2022-12-04 DIAGNOSIS — T447X6A Underdosing of beta-adrenoreceptor antagonists, initial encounter: Secondary | ICD-10-CM | POA: Diagnosis present

## 2022-12-04 DIAGNOSIS — F419 Anxiety disorder, unspecified: Secondary | ICD-10-CM | POA: Diagnosis present

## 2022-12-04 DIAGNOSIS — Z23 Encounter for immunization: Secondary | ICD-10-CM

## 2022-12-04 HISTORY — DX: Unspecified osteoarthritis, unspecified site: M19.90

## 2022-12-04 HISTORY — DX: Morbid (severe) obesity due to excess calories: E66.01

## 2022-12-04 LAB — COMPREHENSIVE METABOLIC PANEL
ALT: 14 U/L (ref 0–44)
AST: 20 U/L (ref 15–41)
Albumin: 4 g/dL (ref 3.5–5.0)
Alkaline Phosphatase: 66 U/L (ref 38–126)
Anion gap: 13 (ref 5–15)
BUN: 10 mg/dL (ref 6–20)
CO2: 20 mmol/L — ABNORMAL LOW (ref 22–32)
Calcium: 9 mg/dL (ref 8.9–10.3)
Chloride: 102 mmol/L (ref 98–111)
Creatinine, Ser: 0.8 mg/dL (ref 0.44–1.00)
GFR, Estimated: >60 mL/min (ref >60)
Glucose, Bld: 138 mg/dL — ABNORMAL HIGH (ref 70–99)
Potassium: 3.5 mmol/L (ref 3.5–5.1)
Sodium: 135 mmol/L (ref 135–145)
Total Bilirubin: 0.4 mg/dL (ref 0.3–1.2)
Total Protein: 8.2 g/dL — ABNORMAL HIGH (ref 6.5–8.1)

## 2022-12-04 LAB — CBC
HCT: 35 % — ABNORMAL LOW (ref 36.0–46.0)
Hemoglobin: 10.9 g/dL — ABNORMAL LOW (ref 12.0–15.0)
MCH: 25.4 pg — ABNORMAL LOW (ref 26.0–34.0)
MCHC: 31.1 g/dL (ref 30.0–36.0)
MCV: 81.6 fL (ref 80.0–100.0)
Platelets: 265 10*3/uL (ref 150–400)
RBC: 4.29 MIL/uL (ref 3.87–5.11)
RDW: 15.2 % (ref 11.5–15.5)
WBC: 9.1 10*3/uL (ref 4.0–10.5)
nRBC: 0 % (ref 0.0–0.2)

## 2022-12-04 LAB — LIPASE, BLOOD: Lipase: 26 U/L (ref 11–51)

## 2022-12-04 LAB — MAGNESIUM: Magnesium: 2.1 mg/dL (ref 1.7–2.4)

## 2022-12-04 LAB — BRAIN NATRIURETIC PEPTIDE: B Natriuretic Peptide: 47.9 pg/mL (ref 0.0–100.0)

## 2022-12-04 LAB — CBG MONITORING, ED: Glucose-Capillary: 126 mg/dL — ABNORMAL HIGH (ref 70–99)

## 2022-12-04 LAB — TSH: TSH: 1.689 u[IU]/mL (ref 0.350–4.500)

## 2022-12-04 LAB — D-DIMER, QUANTITATIVE: D-Dimer, Quant: 0.6 ug/mL-FEU — ABNORMAL HIGH (ref 0.00–0.50)

## 2022-12-04 MED ORDER — ONDANSETRON HCL 4 MG/2ML IJ SOLN
4.0000 mg | Freq: Once | INTRAMUSCULAR | Status: AC
  Administered 2022-12-04: 4 mg via INTRAVENOUS
  Filled 2022-12-04: qty 2

## 2022-12-04 MED ORDER — FENTANYL CITRATE PF 50 MCG/ML IJ SOSY
50.0000 ug | PREFILLED_SYRINGE | Freq: Once | INTRAMUSCULAR | Status: AC
  Administered 2022-12-04: 50 ug via INTRAVENOUS
  Filled 2022-12-04: qty 1

## 2022-12-04 MED ORDER — FENTANYL CITRATE PF 50 MCG/ML IJ SOSY
50.0000 ug | PREFILLED_SYRINGE | Freq: Once | INTRAMUSCULAR | Status: AC
  Administered 2022-12-04: 50 ug via INTRAMUSCULAR
  Filled 2022-12-04: qty 1

## 2022-12-04 MED ORDER — SODIUM CHLORIDE 0.9 % IV BOLUS
1000.0000 mL | Freq: Once | INTRAVENOUS | Status: AC
  Administered 2022-12-04: 1000 mL via INTRAVENOUS

## 2022-12-04 MED ORDER — IOHEXOL 350 MG/ML SOLN
100.0000 mL | Freq: Once | INTRAVENOUS | Status: AC | PRN
  Administered 2022-12-04: 100 mL via INTRAVENOUS

## 2022-12-04 MED ORDER — METOCLOPRAMIDE HCL 5 MG/ML IJ SOLN
10.0000 mg | Freq: Once | INTRAMUSCULAR | Status: AC
  Administered 2022-12-04: 10 mg via INTRAMUSCULAR
  Filled 2022-12-04: qty 2

## 2022-12-04 MED ORDER — HYDROMORPHONE HCL 1 MG/ML IJ SOLN
1.0000 mg | Freq: Once | INTRAMUSCULAR | Status: AC
  Administered 2022-12-04: 1 mg via INTRAVENOUS
  Filled 2022-12-04: qty 1

## 2022-12-04 MED ORDER — IOHEXOL 300 MG/ML  SOLN
100.0000 mL | Freq: Once | INTRAMUSCULAR | Status: AC | PRN
  Administered 2022-12-04: 125 mL via INTRAVENOUS

## 2022-12-04 NOTE — ED Notes (Signed)
Pt is situated in chair, with legs up, but made sure she understands she must remain on monitor, as she has frequently removed all leads, wires, etc. Pt also used the bathroom on her self and gave needed supplies to change her depends, and removed pants, feels better now.

## 2022-12-04 NOTE — ED Notes (Signed)
US at bedside

## 2022-12-04 NOTE — ED Notes (Addendum)
Ambulated pt down hallway with RN Corrie Dandy, felt SOB, dizzy with spo2 96% and 146HR sustained.  Had to stop halfway, palpated pulse felt very fast, still very symptomatic and turned around to go back to room with an spo2 96-97% and still sustained 145-146 HR.  Upon entering the room, sat in chair, after 30 seconds HR was down to 120's,  then 30 seconds later was down to 110's, with a 106 HR on EKG. Felt very SOB and looked winded. EKG repeated and EDP aware.

## 2022-12-04 NOTE — ED Provider Notes (Signed)
[place] [facility] AT [facility] Provider Note   CSN: [id number] Arrival date & time: 12/04/22  1044     History  Chief Complaint  Patient presents with   Abdominal Pain    Amy Boyle is a 57 y.o. female with a past medical history of DM, HTN, anxiety, depression, who presents emergency department complaining of generalized abdominal pain onset yesterday.  Notes history of similar symptoms.  Does not have a GI specialist at this time.  Has associated nausea, vomiting, soft nonbloody stools.  Also voicing concerns for dyspnea on exertion.  Has a history of congestive heart failure and is compliant with her Lasix.  Notes that she is due to schedule appoint with her cardiologist.  Denies concerns for chest pain at this time.  The history is provided by the patient. No language interpreter was used.       Home Medications Prior to Admission medications   Medication Sig Start Date End Date Taking? Authorizing Provider  albuterol (VENTOLIN HFA) 108 (90 Base) MCG/ACT inhaler Inhale into the lungs. 04/29/19   [provider]  buPROPion (WELLBUTRIN XL) 300 MG 24 hr tablet Take by mouth.    [provider]  Dulaglutide (TRULICITY) 1.5 MG/0.5ML SOPN Inject into the skin. 03/23/18   [provider]  ferrous sulfate 325 (65 FE) MG tablet Take 1 tablet by mouth daily. 04/23/19   [provider]  furosemide (LASIX) 40 MG tablet Take by mouth. 11/15/19   [provider]  hydrochlorothiazide (HYDRODIURIL) 25 MG tablet Take 1 tablet by mouth daily. 04/08/18   [provider]  LORazepam (ATIVAN) 1 MG tablet Take 1 tablet (1 mg total) by mouth 3 (three) times daily as needed for anxiety. 02/14/21   Molpus, John, MD  metoprolol tartrate (LOPRESSOR) 25 MG tablet Take 1 tablet by mouth 2 (two) times daily. 01/19/20   [provider]  ondansetron (ZOFRAN) 4 MG tablet Take 1 tablet (4 mg total) by mouth every 6 (six)  hours. 02/24/21   Wynetta Fines, MD  ondansetron (ZOFRAN-ODT) 8 MG disintegrating tablet 8mg  ODT q4 hours prn nausea 08/14/21   Geoffery Lyons, MD  Oxycodone HCl 10 MG TABS Take by mouth.    [provider]  PARoxetine (PAXIL) 30 MG tablet Take 1 tablet (30 mg total) by mouth daily. 02/14/21   Molpus, John, MD  potassium chloride (KLOR-CON) 10 MEQ tablet Take by mouth. 11/15/19   [provider]  topiramate (TOPAMAX) 100 MG tablet Take by mouth. 03/16/19   [provider]      Allergies    Patient has no known allergies.    Review of Systems   Review of Systems  Gastrointestinal:  Positive for abdominal pain.  All other systems reviewed and are negative.   Physical Exam Updated Vital Signs BP (!) 179/99   Pulse (!) 54   Temp 98.5 F (36.9 C) (Oral)   Resp 19   Ht 5\' 6"  (1.676 m)   Wt (!) 163.3 kg   SpO2 96%   BMI 58.11 kg/m  Physical Exam Vitals and nursing note reviewed.  Constitutional:      General: She is not in acute distress.    Appearance: Normal appearance.  Eyes:     General: No scleral icterus.    Extraocular Movements: Extraocular movements intact.  Cardiovascular:     Rate and Rhythm: Normal rate and regular rhythm.     Pulses: Normal pulses.  Heart sounds: Normal heart sounds.  Pulmonary:     Effort: Pulmonary effort is normal. No respiratory distress.     Breath sounds: Normal breath sounds.  Abdominal:     Palpations: Abdomen is soft. There is no mass.     Tenderness: There is generalized abdominal tenderness.     Comments: Diffuse abdominal tenderness to palpation.  Musculoskeletal:        General: Normal range of motion.     Cervical back: Neck supple.     Comments: Edema noted to bilateral lower extremities.  Skin:    General: Skin is warm and dry.     Findings: No rash.  Neurological:     Mental Status: She is alert.     Sensory: Sensation is intact.     Motor: Motor function is intact.  Psychiatric:         Behavior: Behavior normal.     ED Results / Procedures / Treatments   Labs (all labs ordered are listed, but only abnormal results are displayed) Labs Reviewed  CBC - Abnormal; Notable for the following components:      Result Value   Hemoglobin 10.9 (*)    HCT 35.0 (*)    MCH 25.4 (*)    All other components within normal limits  COMPREHENSIVE METABOLIC PANEL - Abnormal; Notable for the following components:   CO2 20 (*)    Glucose, Bld 138 (*)    Total Protein 8.2 (*)    All other components within normal limits  D-DIMER, QUANTITATIVE - Abnormal; Notable for the following components:   D-Dimer, Quant 0.60 (*)    All other components within normal limits  CBG MONITORING, ED - Abnormal; Notable for the following components:   Glucose-Capillary 126 (*)    All other components within normal limits  BRAIN NATRIURETIC PEPTIDE  LIPASE, BLOOD  MAGNESIUM  TSH    EKG EKG Interpretation  Date/Time:  Wednesday Dec 04 2022 11:14:09 EDT Ventricular Rate:  46 PR Interval:  141 QRS Duration: 93 QT Interval:  486 QTC Calculation: 426 R Axis:   47 Text Interpretation: Sinus bradycardia Confirmed by Ernie Avena ([id number]) on 12/04/2022 11:21:22 AM  Radiology US PELVIC COMPLETE WITH TRANSVAGINAL  Result Date: 12/04/2022 CLINICAL DATA:  Endometrial thickening seen on CT. EXAM: TRANSABDOMINAL AND TRANSVAGINAL ULTRASOUND OF PELVIS TECHNIQUE: Both transabdominal and transvaginal ultrasound examinations of the pelvis were performed. Transabdominal technique was performed for global imaging of the pelvis including uterus, ovaries, adnexal regions, and pelvic cul-de-sac. It was necessary to proceed with endovaginal exam following the transabdominal exam to visualize the endometrium and ovaries. COMPARISON:  CT abdomen pelvis dated 12/04/2022. FINDINGS: Evaluation is limited due to body habitus. Uterus Measurements: 10.5 x 6.2 x 7.3 cm = volume: 250 mL. No fibroids or other mass visualized.  Endometrium Thickness: 3.7 cm. The endometrium is thickened with internal vascularity. Findings may represent endometrial hyperplasia or polyps but concerning for neoplasm. Further evaluation with hysteroscopy is recommended. Right ovary Not visualized. Left ovary Not visualized. Other findings No abnormal free fluid. IMPRESSION: Thickened endometrium concerning for neoplasm. Further evaluation with hysteroscopy is recommended. Electronically Signed   By: Elgie Collard M.D.   On: 12/04/2022 15:48   DG Chest 2 View  Result Date: 12/04/2022 CLINICAL DATA:  Shortness of breath EXAM: CHEST - 2 VIEW COMPARISON:  X-ray 02/24/2021 and older FINDINGS: Enlarged cardiopericardial silhouette. Vascular congestion with trace edema. No pneumothorax or effusion. No consolidation. Degenerative changes of the spine. Overlapping cardiac leads IMPRESSION:  Enlarged cardiopericardial silhouette with some vascular congestion. Electronically Signed   By: Karen Kays M.D.   On: 12/04/2022 13:30   CT ABDOMEN PELVIS W CONTRAST  Result Date: 12/04/2022 CLINICAL DATA:  Abdominal pain, acute, nonlocalized EXAM: CT ABDOMEN AND PELVIS WITH CONTRAST TECHNIQUE: Multidetector CT imaging of the abdomen and pelvis was performed using the standard protocol following bolus administration of intravenous contrast. RADIATION DOSE REDUCTION: This exam was performed according to the departmental dose-optimization program which includes automated exposure control, adjustment of the mA and/or kV according to patient size and/or use of iterative reconstruction technique. CONTRAST:  OMNIPAQUE IOHEXOL 300 MG/ML  SOLN COMPARISON:  08/13/2021 FINDINGS: Technical note: Examination is degraded by beam hardening and photon starvation secondary to patient body habitus. Lower chest: No acute abnormality. Hepatobiliary: Liver measures 23 cm in length. No focal liver lesion is identified. Unremarkable gallbladder. No hyperdense gallstone. No biliary  dilatation. Pancreas: Unremarkable. No pancreatic ductal dilatation or surrounding inflammatory changes. Spleen: Normal in size without focal abnormality. Adrenals/Urinary Tract: Adrenal glands are unremarkable. Kidneys are grossly unremarkable without renal calculi, focal lesion, or hydronephrosis. Bladder is unremarkable. Stomach/Bowel: Small hiatal hernia. Stomach is otherwise within normal limits. No evidence of appendicitis. No evidence of bowel wall thickening, distention, or inflammatory changes. Vascular/Lymphatic: No significant vascular findings are present. No enlarged abdominal or pelvic lymph nodes. Reproductive: Anteverted uterus. Abnormally thickened appearance of the endometrial stripe measuring approximately 3.7 cm (series 7, image 78). No adnexal masses. Other: No free fluid. No abdominopelvic fluid collection. No pneumoperitoneum. Small fat containing umbilical hernia. Musculoskeletal: No acute or significant osseous findings. IMPRESSION: 1. No acute abdominopelvic findings. 2. Abnormally thickened appearance of the endometrial stripe. Further evaluation with pelvic ultrasound is recommended. 3. Hepatomegaly. 4. Small hiatal hernia. Electronically Signed   By: Duanne Guess D.O.   On: 12/04/2022 13:27    Procedures Procedures    Medications Ordered in ED Medications  sodium chloride 0.9 % bolus 1,000 mL (0 mLs Intravenous Stopped 12/04/22 1410)  ondansetron (ZOFRAN) injection 4 mg (4 mg Intravenous Given 12/04/22 1214)  iohexol (OMNIPAQUE) 300 MG/ML solution 100 mL (125 mLs Intravenous Contrast Given 12/04/22 1244)  fentaNYL (SUBLIMAZE) injection 50 mcg (50 mcg Intravenous Given 12/04/22 1337)  metoCLOPramide (REGLAN) injection 10 mg (10 mg Intramuscular Given 12/04/22 1415)  fentaNYL (SUBLIMAZE) injection 50 mcg (50 mcg Intramuscular Given 12/04/22 1624)    ED Course/ Medical Decision Making/ A&P Clinical Course as of 12/04/22 1842  Wed Dec 04, 2022  1347 Patient reevaluated and noted  that she has had some vaginal bleeding since last night.  Notes that she was postmenopausal.   [SB]  1420 Pt re-evaluated, heart rate improved to 62 while speaking with patient about her heart rate. Pt works 3rd shift and denies taking her metoprolol last night. Notes feeling dizzy at this time.  [SB]  1603 Re-evaluated and patient able to self transfer from bed to recliner. Pt notes continued pain at this time. Discussed with daughter at bedside regarding lab and imaging studies.  [SB]  1651 Consult with Gyn-Oncologist, Dr. Tamela Oddi who recommends patient follow up with benign GYN team regarding US findings.  [SB]  1655 Patient reevaluated and asleep on recliner, patient notes that her pain has improved at this time.  [SB]  1727 Consult with cardiology, Lynnette Caffey who reviewed chart and noted that patient is safe for discharge at this time.  [SB]  1730 Consult with GYN, Dr. Despina Hidden who notes patient should follow up in office for a  biopsy [SB]  1800 Pt ambulated with pulse ox and found to be at 90% and symptomatic with shortness of breath, heart rate at 106. [SB]  1826 D-Dimer, Quant(!): 0.60 [SB]  1827 Discussed with daughter at bedside regarding US findings and dimer findings as well as plans for admission at this time. Discussed with patient and daughter treatment plan. Pt and daughter agreeable at this time. [SB]    Clinical Course User Index [SB] Carver Murakami A, PA-C                             Medical Decision Making Amount and/or Complexity of Data Reviewed Labs: ordered. Decision-making details documented in ED Course. Radiology: ordered.  Risk Prescription drug management. Decision regarding hospitalization.   Patient presents to the emergency department with multiple concerns.  Patient notes concern for diffuse abdominal pain.  Also notes concern for shortness of breath on exertion.  Notes that she is compliant with her Lasix.  Also notes vaginal bleeding.  Patient afebrile, not  tachycardic or hypoxic.  On exam patient with diffuse abdominal tenderness to palpation.  No focal abdominal pain.  Remainder of exam without acute findings.  Pt afebrile. Differential diagnosis includes pancreatitis, cholecystitis, appendicitis, acute cystitis, CHF exacerbation, PTX, PNA, PE.   Labs:  I ordered, and personally interpreted labs.  The pertinent results include:  Dimer elevated at 0.6 CBG at 126 TSH ordered with results pending at time of sign out Magnesium at 2.1 BNP at 47.9 and unremarkable Lipase unremarkable CBC without leukocytosis CMP unremarkable  Imaging: I ordered imaging studies including CT abdomen pelvis, ultrasound, CTA chest ordered results pending at time of signout I independently visualized and interpreted imaging which showed CT AP with  1. No acute abdominopelvic findings.  2. Abnormally thickened appearance of the endometrial stripe.  Further evaluation with pelvic ultrasound is recommended.  3. Hepatomegaly.  4. Small hiatal hernia.   US pelvis with Thickened endometrium concerning for neoplasm.  CXR with  Enlarged cardiopericardial silhouette with some vascular congestion.      I agree with the radiologist interpretation  Medications:  I ordered medication including fentanyl, reglan, IVF, zofran for symptom management.  Reevaluation of the patient after these medicines and interventions, I reevaluated the patient and found that they have improved I have reviewed the patients home medicines and have made adjustments as needed  Consultations: I requested consultation with the Cardiologist. Dr. Lynnette Caffey , and discussed lab and imaging findings as well as pertinent plan - they recommend: patient can be discharged home at this time. No acute findings noted on CXR or EKG -Consultation with OBGYN, Dr. Despina Hidden who recommends follow up in the outpatient setting for biopsy   Patient case discussed with Jeanelle Malling, PA-C at sign-out. Plan at sign-out is  pending CTA Chest, likely Admission to the hospital, however, plans may change as per oncoming team. Patient care transferred at sign out.   This chart was dictated using voice recognition software, Dragon. Despite the best efforts of this provider to proofread and correct errors, errors may still occur which can change documentation meaning. Final Clinical Impression(s) / ED Diagnoses Final diagnoses:  Generalized abdominal pain    Rx / DC Orders ED Discharge Orders     None         Jerad Dunlap A, PA-C 12/04/22 1853    Ernie Avena, MD 12/04/22 1945

## 2022-12-04 NOTE — ED Notes (Signed)
May give ice chips per EDP.

## 2022-12-04 NOTE — ED Triage Notes (Signed)
Pt reports to ED with complaints of nausea, vomiting, and diarrhea. States that is started yesterday. States that she is also having shortness of breath with ambulation and when she is vomiting

## 2022-12-04 NOTE — ED Notes (Signed)
Pt in chair at bedside. Provider made aware of sinus brady.

## 2022-12-04 NOTE — ED Notes (Signed)
Ambulated pt around by triage rooms, SpO2 was 90% with HR of 106 during ambulation.  She had to stop halfway through to catch her breath, then continued back to room under own power. After sitting for maybe 5-10 seconds, sats quickly rebounded to 99% and HR of 74. Asked how she felt, she was still SOB. Sats stayed at 97-99% after sitting in room again.

## 2022-12-05 ENCOUNTER — Encounter (HOSPITAL_COMMUNITY): Payer: Self-pay | Admitting: Family Medicine

## 2022-12-05 ENCOUNTER — Observation Stay (HOSPITAL_BASED_OUTPATIENT_CLINIC_OR_DEPARTMENT_OTHER): Payer: PRIVATE HEALTH INSURANCE

## 2022-12-05 ENCOUNTER — Other Ambulatory Visit: Payer: Self-pay

## 2022-12-05 DIAGNOSIS — Z91138 Patient's unintentional underdosing of medication regimen for other reason: Secondary | ICD-10-CM | POA: Diagnosis not present

## 2022-12-05 DIAGNOSIS — I110 Hypertensive heart disease with heart failure: Secondary | ICD-10-CM | POA: Diagnosis not present

## 2022-12-05 DIAGNOSIS — K2900 Acute gastritis without bleeding: Secondary | ICD-10-CM | POA: Diagnosis not present

## 2022-12-05 DIAGNOSIS — R000 Tachycardia, unspecified: Secondary | ICD-10-CM | POA: Diagnosis not present

## 2022-12-05 DIAGNOSIS — Z6841 Body Mass Index (BMI) 40.0 and over, adult: Secondary | ICD-10-CM | POA: Diagnosis not present

## 2022-12-05 DIAGNOSIS — F32A Depression, unspecified: Secondary | ICD-10-CM | POA: Diagnosis present

## 2022-12-05 DIAGNOSIS — N950 Postmenopausal bleeding: Secondary | ICD-10-CM | POA: Diagnosis not present

## 2022-12-05 DIAGNOSIS — E860 Dehydration: Secondary | ICD-10-CM | POA: Diagnosis not present

## 2022-12-05 DIAGNOSIS — R9389 Abnormal findings on diagnostic imaging of other specified body structures: Secondary | ICD-10-CM | POA: Diagnosis not present

## 2022-12-05 DIAGNOSIS — E662 Morbid (severe) obesity with alveolar hypoventilation: Secondary | ICD-10-CM | POA: Diagnosis not present

## 2022-12-05 DIAGNOSIS — K760 Fatty (change of) liver, not elsewhere classified: Secondary | ICD-10-CM | POA: Diagnosis not present

## 2022-12-05 DIAGNOSIS — R0902 Hypoxemia: Secondary | ICD-10-CM | POA: Diagnosis not present

## 2022-12-05 DIAGNOSIS — T383X6A Underdosing of insulin and oral hypoglycemic [antidiabetic] drugs, initial encounter: Secondary | ICD-10-CM | POA: Diagnosis not present

## 2022-12-05 DIAGNOSIS — R0609 Other forms of dyspnea: Secondary | ICD-10-CM

## 2022-12-05 DIAGNOSIS — T501X6A Underdosing of loop [high-ceiling] diuretics, initial encounter: Secondary | ICD-10-CM | POA: Diagnosis not present

## 2022-12-05 DIAGNOSIS — R0600 Dyspnea, unspecified: Secondary | ICD-10-CM | POA: Diagnosis present

## 2022-12-05 DIAGNOSIS — E1165 Type 2 diabetes mellitus with hyperglycemia: Secondary | ICD-10-CM | POA: Diagnosis not present

## 2022-12-05 DIAGNOSIS — I5033 Acute on chronic diastolic (congestive) heart failure: Secondary | ICD-10-CM | POA: Diagnosis not present

## 2022-12-05 DIAGNOSIS — I10 Essential (primary) hypertension: Secondary | ICD-10-CM | POA: Diagnosis present

## 2022-12-05 DIAGNOSIS — R160 Hepatomegaly, not elsewhere classified: Secondary | ICD-10-CM | POA: Diagnosis not present

## 2022-12-05 DIAGNOSIS — D638 Anemia in other chronic diseases classified elsewhere: Secondary | ICD-10-CM | POA: Diagnosis not present

## 2022-12-05 DIAGNOSIS — R109 Unspecified abdominal pain: Secondary | ICD-10-CM | POA: Diagnosis present

## 2022-12-05 DIAGNOSIS — E119 Type 2 diabetes mellitus without complications: Secondary | ICD-10-CM

## 2022-12-05 DIAGNOSIS — R001 Bradycardia, unspecified: Secondary | ICD-10-CM | POA: Diagnosis not present

## 2022-12-05 DIAGNOSIS — F419 Anxiety disorder, unspecified: Secondary | ICD-10-CM | POA: Diagnosis present

## 2022-12-05 DIAGNOSIS — R1084 Generalized abdominal pain: Secondary | ICD-10-CM | POA: Diagnosis not present

## 2022-12-05 DIAGNOSIS — R531 Weakness: Secondary | ICD-10-CM | POA: Diagnosis not present

## 2022-12-05 DIAGNOSIS — T447X6A Underdosing of beta-adrenoreceptor antagonists, initial encounter: Secondary | ICD-10-CM | POA: Diagnosis not present

## 2022-12-05 DIAGNOSIS — Z23 Encounter for immunization: Secondary | ICD-10-CM | POA: Diagnosis not present

## 2022-12-05 LAB — CBC WITH DIFFERENTIAL/PLATELET
Abs Immature Granulocytes: 0.07 10*3/uL (ref 0.00–0.07)
Basophils Absolute: 0.1 10*3/uL (ref 0.0–0.1)
Basophils Relative: 1 %
Eosinophils Absolute: 0 10*3/uL (ref 0.0–0.5)
Eosinophils Relative: 0 %
HCT: 35.1 % — ABNORMAL LOW (ref 36.0–46.0)
Hemoglobin: 10.8 g/dL — ABNORMAL LOW (ref 12.0–15.0)
Immature Granulocytes: 1 %
Lymphocytes Relative: 19 %
Lymphs Abs: 2.1 10*3/uL (ref 0.7–4.0)
MCH: 25 pg — ABNORMAL LOW (ref 26.0–34.0)
MCHC: 30.8 g/dL (ref 30.0–36.0)
MCV: 81.3 fL (ref 80.0–100.0)
Monocytes Absolute: 0.7 10*3/uL (ref 0.1–1.0)
Monocytes Relative: 7 %
Neutro Abs: 8 10*3/uL — ABNORMAL HIGH (ref 1.7–7.7)
Neutrophils Relative %: 72 %
Platelets: 222 10*3/uL (ref 150–400)
RBC: 4.32 MIL/uL (ref 3.87–5.11)
RDW: 15.4 % (ref 11.5–15.5)
WBC: 10.9 10*3/uL — ABNORMAL HIGH (ref 4.0–10.5)
nRBC: 0 % (ref 0.0–0.2)

## 2022-12-05 LAB — ECHOCARDIOGRAM COMPLETE
AR max vel: 2.48 cm2
AV Area VTI: 2.9 cm2
AV Area mean vel: 2.63 cm2
AV Mean grad: 9 mmHg
AV Peak grad: 16.3 mmHg
Ao pk vel: 2.02 m/s
Area-P 1/2: 3.21 cm2
Height: 66 in
S' Lateral: 2.6 cm
Weight: 5827.2 oz

## 2022-12-05 LAB — HEMOGLOBIN A1C
Hgb A1c MFr Bld: 6.1 % — ABNORMAL HIGH (ref 4.8–5.6)
Mean Plasma Glucose: 128.37 mg/dL

## 2022-12-05 LAB — COMPREHENSIVE METABOLIC PANEL
ALT: 15 U/L (ref 0–44)
AST: 23 U/L (ref 15–41)
Albumin: 3.7 g/dL (ref 3.5–5.0)
Alkaline Phosphatase: 59 U/L (ref 38–126)
Anion gap: 13 (ref 5–15)
BUN: 10 mg/dL (ref 6–20)
CO2: 23 mmol/L (ref 22–32)
Calcium: 9.5 mg/dL (ref 8.9–10.3)
Chloride: 102 mmol/L (ref 98–111)
Creatinine, Ser: 0.81 mg/dL (ref 0.44–1.00)
GFR, Estimated: >60 mL/min (ref >60)
Glucose, Bld: 122 mg/dL — ABNORMAL HIGH (ref 70–99)
Potassium: 3.7 mmol/L (ref 3.5–5.1)
Sodium: 138 mmol/L (ref 135–145)
Total Bilirubin: 0.9 mg/dL (ref 0.3–1.2)
Total Protein: 7.8 g/dL (ref 6.5–8.1)

## 2022-12-05 LAB — GLUCOSE, CAPILLARY
Glucose-Capillary: 100 mg/dL — ABNORMAL HIGH (ref 70–99)
Glucose-Capillary: 108 mg/dL — ABNORMAL HIGH (ref 70–99)
Glucose-Capillary: 98 mg/dL (ref 70–99)
Glucose-Capillary: 142 mg/dL — ABNORMAL HIGH (ref 70–99)

## 2022-12-05 LAB — MAGNESIUM: Magnesium: 2.1 mg/dL (ref 1.7–2.4)

## 2022-12-05 MED ORDER — BISACODYL 10 MG RE SUPP: 10.0000 mg | Freq: Every day | RECTAL | Status: DC | PRN

## 2022-12-05 MED ORDER — ONDANSETRON HCL 4 MG/2ML IJ SOLN
4.0000 mg | Freq: Four times a day (QID) | INTRAMUSCULAR | Status: DC | PRN
  Administered 2022-12-05 (×2): 4 mg via INTRAVENOUS
  Filled 2022-12-05 (×2): qty 2

## 2022-12-05 MED ORDER — LIVING WELL WITH DIABETES BOOK
Freq: Once | Status: AC
  Filled 2022-12-05: qty 1

## 2022-12-05 MED ORDER — SODIUM CHLORIDE 0.9% FLUSH
3.0000 mL | Freq: Two times a day (BID) | INTRAVENOUS | Status: DC
  Administered 2022-12-06 – 2022-12-07 (×3): 3 mL via INTRAVENOUS

## 2022-12-05 MED ORDER — HYDROMORPHONE HCL 1 MG/ML IJ SOLN
1.0000 mg | INTRAMUSCULAR | Status: DC | PRN
  Administered 2022-12-05 – 2022-12-07 (×13): 1 mg via INTRAVENOUS
  Filled 2022-12-05 (×13): qty 1

## 2022-12-05 MED ORDER — ONDANSETRON HCL 4 MG/2ML IJ SOLN
4.0000 mg | Freq: Four times a day (QID) | INTRAMUSCULAR | Status: DC
  Administered 2022-12-06 – 2022-12-07 (×6): 4 mg via INTRAVENOUS
  Filled 2022-12-05 (×6): qty 2

## 2022-12-05 MED ORDER — HYDROMORPHONE HCL 1 MG/ML IJ SOLN
0.5000 mg | INTRAMUSCULAR | Status: DC | PRN
  Administered 2022-12-05 (×3): 0.5 mg via INTRAVENOUS
  Filled 2022-12-05 (×3): qty 0.5

## 2022-12-05 MED ORDER — METOCLOPRAMIDE HCL 5 MG/ML IJ SOLN
10.0000 mg | Freq: Four times a day (QID) | INTRAMUSCULAR | Status: DC | PRN
  Administered 2022-12-05 – 2022-12-06 (×4): 10 mg via INTRAVENOUS
  Filled 2022-12-05 (×4): qty 2

## 2022-12-05 MED ORDER — ACETAMINOPHEN 650 MG RE SUPP: 650.0000 mg | Freq: Four times a day (QID) | RECTAL | Status: DC | PRN

## 2022-12-05 MED ORDER — MELATONIN 3 MG PO TABS: 3.0000 mg | ORAL_TABLET | Freq: Every evening | ORAL | Status: DC | PRN

## 2022-12-05 MED ORDER — INSULIN ASPART 100 UNIT/ML IJ SOLN: 0.0000 [IU] | Freq: Every day | INTRAMUSCULAR | Status: DC

## 2022-12-05 MED ORDER — PANTOPRAZOLE SODIUM 40 MG IV SOLR
40.0000 mg | Freq: Two times a day (BID) | INTRAVENOUS | Status: DC
  Administered 2022-12-05 – 2022-12-07 (×4): 40 mg via INTRAVENOUS
  Filled 2022-12-05 (×4): qty 10

## 2022-12-05 MED ORDER — ALUM & MAG HYDROXIDE-SIMETH 200-200-20 MG/5ML PO SUSP: 30.0000 mL | Freq: Four times a day (QID) | ORAL | Status: DC | PRN

## 2022-12-05 MED ORDER — AMLODIPINE BESYLATE 5 MG PO TABS
5.0000 mg | ORAL_TABLET | Freq: Every day | ORAL | Status: DC
  Administered 2022-12-05 – 2022-12-07 (×3): 5 mg via ORAL
  Filled 2022-12-05 (×3): qty 1

## 2022-12-05 MED ORDER — HYDROMORPHONE HCL 1 MG/ML IJ SOLN
0.5000 mg | Freq: Once | INTRAMUSCULAR | Status: AC
  Administered 2022-12-05: 0.5 mg via INTRAVENOUS
  Filled 2022-12-05: qty 0.5

## 2022-12-05 MED ORDER — ACETAMINOPHEN 325 MG PO TABS
650.0000 mg | ORAL_TABLET | Freq: Four times a day (QID) | ORAL | Status: DC | PRN
  Administered 2022-12-05 – 2022-12-07 (×4): 650 mg via ORAL
  Filled 2022-12-05 (×5): qty 2

## 2022-12-05 MED ORDER — PNEUMOCOCCAL 20-VAL CONJ VACC 0.5 ML IM SUSY
0.5000 mL | PREFILLED_SYRINGE | INTRAMUSCULAR | Status: AC
  Administered 2022-12-06: 0.5 mL via INTRAMUSCULAR
  Filled 2022-12-05: qty 0.5

## 2022-12-05 MED ORDER — ENOXAPARIN SODIUM 80 MG/0.8ML IJ SOSY
80.0000 mg | PREFILLED_SYRINGE | INTRAMUSCULAR | Status: DC
  Administered 2022-12-05: 80 mg via SUBCUTANEOUS
  Filled 2022-12-05 (×2): qty 0.8

## 2022-12-05 MED ORDER — LORAZEPAM 1 MG PO TABS
1.0000 mg | ORAL_TABLET | Freq: Three times a day (TID) | ORAL | Status: DC | PRN
  Administered 2022-12-05 – 2022-12-07 (×6): 1 mg via ORAL
  Filled 2022-12-05 (×6): qty 1

## 2022-12-05 MED ORDER — SENNOSIDES-DOCUSATE SODIUM 8.6-50 MG PO TABS
1.0000 | ORAL_TABLET | Freq: Two times a day (BID) | ORAL | Status: DC
  Administered 2022-12-05 – 2022-12-07 (×4): 1 via ORAL
  Filled 2022-12-05 (×5): qty 1

## 2022-12-05 MED ORDER — ONDANSETRON HCL 4 MG PO TABS: 4.0000 mg | ORAL_TABLET | Freq: Four times a day (QID) | ORAL | Status: DC | PRN

## 2022-12-05 MED ORDER — ONDANSETRON HCL 4 MG/2ML IJ SOLN: 4.0000 mg | Freq: Four times a day (QID) | INTRAMUSCULAR | Status: DC | PRN

## 2022-12-05 MED ORDER — SODIUM CHLORIDE 0.9 % IV SOLN: INTRAVENOUS | Status: DC

## 2022-12-05 MED ORDER — OXYCODONE HCL 5 MG PO TABS
10.0000 mg | ORAL_TABLET | Freq: Four times a day (QID) | ORAL | Status: DC | PRN
  Administered 2022-12-06 – 2022-12-07 (×2): 10 mg via ORAL
  Filled 2022-12-05 (×2): qty 2

## 2022-12-05 MED ORDER — ACETAMINOPHEN 325 MG PO TABS
650.0000 mg | ORAL_TABLET | Freq: Four times a day (QID) | ORAL | Status: DC | PRN
  Administered 2022-12-05: 650 mg via ORAL
  Filled 2022-12-05: qty 2

## 2022-12-05 MED ORDER — INSULIN ASPART 100 UNIT/ML IJ SOLN
0.0000 [IU] | Freq: Three times a day (TID) | INTRAMUSCULAR | Status: DC
  Administered 2022-12-06 – 2022-12-07 (×3): 1 [IU] via SUBCUTANEOUS

## 2022-12-05 MED ORDER — NALOXONE HCL 0.4 MG/ML IJ SOLN: 0.4000 mg | INTRAMUSCULAR | Status: DC | PRN

## 2022-12-05 MED ORDER — PERFLUTREN LIPID MICROSPHERE
1.0000 mL | INTRAVENOUS | Status: AC | PRN
  Administered 2022-12-05: 3 mL via INTRAVENOUS

## 2022-12-05 MED ORDER — POLYETHYLENE GLYCOL 3350 17 G PO PACK: 17.0000 g | PACK | Freq: Every day | ORAL | Status: DC | PRN

## 2022-12-05 NOTE — Plan of Care (Signed)
Nutrition Education Note  RD consulted for nutrition education regarding diabetes.   No results found for: "HGBA1C"  Reviewed patient dietary recall. 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 Does have lunch and dinner: Lunch: [employer] quarter pounder with cheese and a coke Dinner: chicken or fish with greens (per pt: broccoli or string beans) and coke Drinks: reports she consumes around 8 12oz cans of regular coke a day   RD provided "Carbohydrate Counting for People with Diabetes" handout from the [employer] of [employer]. Discussed different food groups and their effects on blood sugar, emphasizing carbohydrate-containing foods. Provided list of carbohydrates and recommended serving sizes of common foods. Discussed importance of controlled and consistent carbohydrate intake throughout the day. Provided examples of ways to balance meals/snacks and encouraged intake of high-fiber, whole grain complex carbohydrates. Discussed the Plate Method for Diabetes and encouraged patient to review nutrition labels. Teach back method used.  Patient reports she feels her biggest issue is drinking regular coke so much. Recommended patient switch to diet coke as regular 12oz can has 39g of sugar, meaning patient is consuming around 468g of sugar daily. She knows she should switch to diet and reports she plans to slowly transition as she doesn't like the taste of diet.  Expect fair compliance.  Body mass index is 58.78 kg/m. Pt meets criteria for morbid obesity based on current BMI.  Current diet order is Carb Modified, patient is consuming approximately 75% of meals at this time. Labs and medications reviewed. No further nutrition interventions warranted at this time. RD contact information provided. If additional nutrition issues arise, please re-consult RD.  Shelle Iron RD, LDN For contact information, refer to [facility].

## 2022-12-05 NOTE — Evaluation (Signed)
Physical Therapy Evaluation Patient Details Name: Amy Boyle MRN: [id number] DOB: 1966/02/10 Today's Date: 12/05/2022  History of Present Illness  57 y.o. female adm 5/1 with medical history significant of diabetes mellitus 2, hypertension, morbid obesity and depression with anxiety.  Patient presented with nausea and vomiting for several days.  She also reports shortness of breath with these episodes.  Clinical Impression  Patient is independently ambulating in  room. Patient is at  baseline and requires no further PT. PT will sign off.       Recommendations for follow up therapy are one component of a multi-disciplinary discharge planning process, led by the attending physician.  Recommendations may be updated based on patient status, additional functional criteria and insurance authorization.  Follow Up Recommendations       Assistance Recommended at Discharge PRN  Patient can return home with the following  Assist for transportation    Equipment Recommendations None recommended by PT  Recommendations for Other Services       Functional Status Assessment Patient has not had a recent decline in their functional status     Precautions / Restrictions Precautions Precautions: Fall Restrictions Weight Bearing Restrictions: No      Mobility  Bed Mobility Overal bed mobility: Independent                  Transfers Overall transfer level: Independent                      Ambulation/Gait Ambulation/Gait assistance: Independent Gait Distance (Feet): 60 Feet Assistive device: None Gait Pattern/deviations: WFL(Within Functional Limits)          Stairs            Wheelchair Mobility    Modified Rankin (Stroke Patients Only)       Balance Overall balance assessment: No apparent balance deficits (not formally assessed)                                           Pertinent Vitals/Pain Pain Assessment Pain Assessment:  No/denies pain    Home Living Family/patient expects to be discharged to:: Private residence Living Arrangements: Alone Available Help at Discharge: Family;Available PRN/intermittently Type of Home: House Home Access: Level entry       Home Layout: One level Home Equipment: None      Prior Function Prior Level of Function : Independent/Modified Independent;Working/employed;Driving             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       [employer]: Right    Extremity/Trunk Assessment   Upper Extremity Assessment Upper Extremity Assessment: Overall WFL for tasks assessed    Lower Extremity Assessment Lower Extremity Assessment: Overall WFL for tasks assessed    Cervical / Trunk Assessment Cervical / Trunk Assessment: Other exceptions Cervical / Trunk Exceptions: body habitus  Communication   Communication: No difficulties  Cognition Arousal/Alertness: Awake/alert Behavior During Therapy: WFL for tasks assessed/performed Overall Cognitive Status: Within Functional Limits for tasks assessed                                          General Comments      Exercises     Assessment/Plan  PT Assessment Patient does not need any further PT services  PT Problem List         PT Treatment Interventions      PT Goals (Current goals can be found in the Care Plan section)  Acute Rehab PT Goals Patient Stated Goal: go home, back to work PT Goal Formulation: All assessment and education complete, DC therapy    Frequency       Co-evaluation               AM-PAC PT "6 Clicks" Mobility  Outcome Measure Help needed turning from your back to your side while in a flat bed without using bedrails?: None Help needed moving from lying on your back to sitting on the side of a flat bed without using bedrails?: None Help needed moving to and from a bed to a chair (including a wheelchair)?: None Help needed standing  up from a chair using your arms (e.g., wheelchair or bedside chair)?: None Help needed to walk in hospital room?: None Help needed climbing 3-5 steps with a railing? : A Little 6 Click Score: 23    End of Session   Activity Tolerance: Patient tolerated treatment well Patient left: in bed;with call bell/phone within reach Nurse Communication: Mobility status      Time: 1610-9604 PT Time Calculation (min) (ACUTE ONLY): 12 min   Charges:   PT Evaluation $PT Eval Low Complexity: 1 Low          Blanchard Kelch PT Acute Rehabilitation Services Office [phone] Weekend pager-[phone]   Rada Hay 12/05/2022, 1:46 PM

## 2022-12-05 NOTE — Progress Notes (Signed)
Plan of Care Note for accepted transfer   Patient: Amy Boyle MRN: [id number]   DOA: 12/04/2022  Facility requesting transfer: [facility]  Requesting Provider: Dr. Rush Landmark   Reason for transfer: SOB   Facility course: 57 yr old lady with hx of HTN, DM, HFpEF, depression, and anxiety presenting with abdominal pain, N/V/D,  vaginal bleeding, and DOE.   ED workup notable for negative CTA chest, thickened endometrium on [place], and HR increase to 140s and severe dyspnea on ambulation.   ED PA spoke with gyn-onc and OBGYN who recommended outpatient follow-up for biopsy. HR was in the 40s intermittently in ED and ED PA reviewed this cardiology who did not feel this required inpatient eval/mgmt.   She was given 1 liter NR, Zofran, Reglan, Dilaudid, and fentanyl in ED.   Plan of care: The patient is accepted for admission to Telemetry unit, at [facility].   Author: Briscoe Deutscher, MD 12/05/2022  Check [email or web address] for on-call coverage.  Nursing staff, Please call [facility] Admits & Consults System-Wide number on Amion as soon as patient's arrival, so appropriate admitting provider can evaluate the pt.

## 2022-12-05 NOTE — ED Notes (Signed)
ED TO INPATIENT HANDOFF REPORT  ED Nurse Name and Phone #: Darryll Capers [phone]  S Name/Age/Gender Amy Boyle 57 y.o. female Room/Bed: MH04/MH04  Code Status   Code Status: Not on file  Home/SNF/Other Home Patient oriented to: self, place, time, and situation Is this baseline? Yes   Triage Complete: Triage complete  Chief Complaint Dyspnea [R06.00]  Triage Note Pt reports to ED with complaints of nausea, vomiting, and diarrhea. States that is started yesterday. States that she is also having shortness of breath with ambulation and when she is vomiting    Allergies No Known Allergies  Level of Care/Admitting Diagnosis ED Disposition     ED Disposition  Admit   Condition  --   Comment  Hospital Area: [facility] [[id number]]  Level of Care: Telemetry [5]  Admit to tele based on following criteria: Monitor for Ischemic changes  Interfacility transfer: Yes  May place patient in observation at [facility] or [facility] if equivalent level of care is available:: Yes  Covid Evaluation: Asymptomatic - no recent exposure (last 10 days) testing not required  Diagnosis: Dyspnea [241871]  Admitting Physician: Briscoe Deutscher [[id number]]  Attending Physician: Briscoe Deutscher [[id number]]          B Medical/Surgery History Past Medical History:  Diagnosis Date   Anxiety    Depression    Diabetes mellitus without complication (HCC)    Hypertension    History reviewed. No pertinent surgical history.   A IV Location/Drains/Wounds Patient Lines/Drains/Airways Status     Active Line/Drains/Airways     Name Placement date Placement time Site Days   Peripheral IV 12/04/22 Right Antecubital 12/04/22  1615  Antecubital  1            Intake/Output Last 24 hours No intake or output data in the 24 hours ending 12/05/22 0047  Labs/Imaging Results for orders placed or performed during the hospital encounter of 12/04/22 (from the past 48  hour(s))  CBC     Status: Abnormal   Collection Time: 12/04/22 11:13 AM  Result Value Ref Range   WBC 9.1 4.0 - 10.5 K/uL   RBC 4.29 3.87 - 5.11 MIL/uL   Hemoglobin 10.9 (L) 12.0 - 15.0 g/dL   HCT 95.2 (L) 84.1 - 32.4 %   MCV 81.6 80.0 - 100.0 fL   MCH 25.4 (L) 26.0 - 34.0 pg   MCHC 31.1 30.0 - 36.0 g/dL   RDW 40.1 02.7 - 25.3 %   Platelets 265 150 - 400 K/uL   nRBC 0.0 0.0 - 0.2 %    Comment: Performed at [facility], [address] [place], [place] [address]  Comprehensive metabolic panel     Status: Abnormal   Collection Time: 12/04/22 11:13 AM  Result Value Ref Range   Sodium 135 135 - 145 mmol/L   Potassium 3.5 3.5 - 5.1 mmol/L   Chloride 102 98 - 111 mmol/L   CO2 20 (L) 22 - 32 mmol/L   Glucose, Bld 138 (H) 70 - 99 mg/dL    Comment: Glucose reference range applies only to samples taken after fasting for at least 8 hours.   BUN 10 6 - 20 mg/dL   Creatinine, Ser 3.47 0.44 - 1.00 mg/dL   Calcium 9.0 8.9 - 42.5 mg/dL   Total Protein 8.2 (H) 6.5 - 8.1 g/dL   Albumin 4.0 3.5 - 5.0 g/dL   AST 20 15 - 41 U/L  ALT 14 0 - 44 U/L   Alkaline Phosphatase 66 38 - 126 U/L   Total Bilirubin 0.4 0.3 - 1.2 mg/dL   GFR, Estimated >96 >04 mL/min    Comment: (NOTE) Calculated using the CKD-EPI Creatinine Equation (2021)    Anion gap 13 5 - 15    Comment: Performed at [facility], [address] [place], [place] [address]  Brain natriuretic peptide     Status: None   Collection Time: 12/04/22 11:13 AM  Result Value Ref Range   B Natriuretic Peptide 47.9 0.0 - 100.0 pg/mL    Comment: Performed at [facility], [address] [place], [place] [address]  Lipase, blood     Status: None   Collection Time: 12/04/22 11:13 AM  Result Value Ref Range   Lipase 26 11 - 51 U/L    Comment: Performed at [facility], [address] [place], [place] [address]  Magnesium     Status: None   Collection Time: 12/04/22 11:13 AM  Result Value Ref Range    Magnesium 2.1 1.7 - 2.4 mg/dL    Comment: Performed at [facility], [address] [place], [place] [address]  CBG monitoring, ED     Status: Abnormal   Collection Time: 12/04/22  2:42 PM  Result Value Ref Range   Glucose-Capillary 126 (H) 70 - 99 mg/dL    Comment: Glucose reference range applies only to samples taken after fasting for at least 8 hours.  TSH     Status: None   Collection Time: 12/04/22  3:53 PM  Result Value Ref Range   TSH 1.689 0.350 - 4.500 uIU/mL    Comment: Performed by a 3rd Generation assay with a functional sensitivity of <=0.01 uIU/mL. Performed at [facility], [address] [place], [place] [address]   D-dimer, quantitative     Status: Abnormal   Collection Time: 12/04/22  6:10 PM  Result Value Ref Range   D-Dimer, Quant 0.60 (H) 0.00 - 0.50 ug/mL-FEU    Comment: (NOTE) At the manufacturer cut-off value of 0.5 g/mL FEU, this assay has a negative predictive value of 95-100%.This assay is intended for use in conjunction with a clinical pretest probability (PTP) assessment model to exclude pulmonary embolism (PE) and deep venous thrombosis (DVT) in outpatients suspected of PE or DVT. Results should be correlated with clinical presentation. Performed at [facility], [address] [place], [place] [address]    CT Angio Chest PE W and/or Wo Contrast  Result Date: 12/04/2022 CLINICAL DATA:  Dyspnea. Elevated D-dimer. Intermediate probability for pulmonary embolism. EXAM: CT ANGIOGRAPHY CHEST WITH CONTRAST TECHNIQUE: Multidetector CT imaging of the chest was performed using the standard protocol during bolus administration of intravenous contrast. Multiplanar CT image reconstructions and MIPs were obtained to evaluate the vascular anatomy. RADIATION DOSE REDUCTION: This exam was performed according to the departmental dose-optimization program which includes automated exposure control, adjustment of the mA and/or kV according to  patient size and/or use of iterative reconstruction technique. CONTRAST:  OMNIPAQUE IOHEXOL 350 MG/ML SOLN COMPARISON:  None Available. FINDINGS: Cardiovascular: Satisfactory opacification of pulmonary arteries noted. Technically suboptimal exam due to significant beam hardening artifact from position of patient's left arm. No pulmonary emboli identified. No evidence of thoracic aortic dissection or aneurysm. Mediastinum/Nodes: No masses or pathologically enlarged lymph nodes identified. Lungs/Pleura: No pulmonary mass, infiltrate, or effusion. Upper abdomen: No acute findings.  Musculoskeletal: No suspicious bone lesions identified. Review of the MIP images confirms the above findings. IMPRESSION: No evidence of pulmonary embolism or other active disease. Electronically Signed   By: Danae Orleans M.D.   On: 12/04/2022 20:07   US PELVIC COMPLETE WITH TRANSVAGINAL  Result Date: 12/04/2022 CLINICAL DATA:  Endometrial thickening seen on CT. EXAM: TRANSABDOMINAL AND TRANSVAGINAL ULTRASOUND OF PELVIS TECHNIQUE: Both transabdominal and transvaginal ultrasound examinations of the pelvis were performed. Transabdominal technique was performed for global imaging of the pelvis including uterus, ovaries, adnexal regions, and pelvic cul-de-sac. It was necessary to proceed with endovaginal exam following the transabdominal exam to visualize the endometrium and ovaries. COMPARISON:  CT abdomen pelvis dated 12/04/2022. FINDINGS: Evaluation is limited due to body habitus. Uterus Measurements: 10.5 x 6.2 x 7.3 cm = volume: 250 mL. No fibroids or other mass visualized. Endometrium Thickness: 3.7 cm. The endometrium is thickened with internal vascularity. Findings may represent endometrial hyperplasia or polyps but concerning for neoplasm. Further evaluation with hysteroscopy is recommended. Right ovary Not visualized. Left ovary Not visualized. Other findings No abnormal free fluid. IMPRESSION: Thickened endometrium concerning  for neoplasm. Further evaluation with hysteroscopy is recommended. Electronically Signed   By: Elgie Collard M.D.   On: 12/04/2022 15:48   DG Chest 2 View  Result Date: 12/04/2022 CLINICAL DATA:  Shortness of breath EXAM: CHEST - 2 VIEW COMPARISON:  X-ray 02/24/2021 and older FINDINGS: Enlarged cardiopericardial silhouette. Vascular congestion with trace edema. No pneumothorax or effusion. No consolidation. Degenerative changes of the spine. Overlapping cardiac leads IMPRESSION: Enlarged cardiopericardial silhouette with some vascular congestion. Electronically Signed   By: Karen Kays M.D.   On: 12/04/2022 13:30   CT ABDOMEN PELVIS W CONTRAST  Result Date: 12/04/2022 CLINICAL DATA:  Abdominal pain, acute, nonlocalized EXAM: CT ABDOMEN AND PELVIS WITH CONTRAST TECHNIQUE: Multidetector CT imaging of the abdomen and pelvis was performed using the standard protocol following bolus administration of intravenous contrast. RADIATION DOSE REDUCTION: This exam was performed according to the departmental dose-optimization program which includes automated exposure control, adjustment of the mA and/or kV according to patient size and/or use of iterative reconstruction technique. CONTRAST:  OMNIPAQUE IOHEXOL 300 MG/ML  SOLN COMPARISON:  08/13/2021 FINDINGS: Technical note: Examination is degraded by beam hardening and photon starvation secondary to patient body habitus. Lower chest: No acute abnormality. Hepatobiliary: Liver measures 23 cm in length. No focal liver lesion is identified. Unremarkable gallbladder. No hyperdense gallstone. No biliary dilatation. Pancreas: Unremarkable. No pancreatic ductal dilatation or surrounding inflammatory changes. Spleen: Normal in size without focal abnormality. Adrenals/Urinary Tract: Adrenal glands are unremarkable. Kidneys are grossly unremarkable without renal calculi, focal lesion, or hydronephrosis. Bladder is unremarkable. Stomach/Bowel: Small hiatal hernia. Stomach  is otherwise within normal limits. No evidence of appendicitis. No evidence of bowel wall thickening, distention, or inflammatory changes. Vascular/Lymphatic: No significant vascular findings are present. No enlarged abdominal or pelvic lymph nodes. Reproductive: Anteverted uterus. Abnormally thickened appearance of the endometrial stripe measuring approximately 3.7 cm (series 7, image 78). No adnexal masses. Other: No free fluid. No abdominopelvic fluid collection. No pneumoperitoneum. Small fat containing umbilical hernia. Musculoskeletal: No acute or significant osseous findings. IMPRESSION: 1. No acute abdominopelvic findings. 2. Abnormally thickened appearance of the endometrial stripe. Further evaluation with pelvic ultrasound is recommended. 3. Hepatomegaly. 4. Small hiatal hernia. Electronically Signed   By: Duanne Guess D.O.   On: 12/04/2022 13:27    Pending Labs Unresulted Labs (From admission, onward)    None  Vitals/Pain Today's Vitals   12/04/22 2312 12/04/22 2345 12/05/22 0000 12/05/22 0045  BP:  134/77 94/79   Pulse:  (!) 120 84 60  Resp:  18 (!) 23 15  Temp: 98.6 F (37 C)     TempSrc: Oral     SpO2:  96% 95% 96%  Weight:      Height:      PainSc:        Isolation Precautions No active isolations  Medications Medications  sodium chloride 0.9 % bolus 1,000 mL (0 mLs Intravenous Stopped 12/04/22 1410)  ondansetron (ZOFRAN) injection 4 mg (4 mg Intravenous Given 12/04/22 1214)  iohexol (OMNIPAQUE) 300 MG/ML solution 100 mL (125 mLs Intravenous Contrast Given 12/04/22 1244)  fentaNYL (SUBLIMAZE) injection 50 mcg (50 mcg Intravenous Given 12/04/22 1337)  metoCLOPramide (REGLAN) injection 10 mg (10 mg Intramuscular Given 12/04/22 1415)  fentaNYL (SUBLIMAZE) injection 50 mcg (50 mcg Intramuscular Given 12/04/22 1624)  HYDROmorphone (DILAUDID) injection 1 mg (1 mg Intravenous Given 12/04/22 1949)  ondansetron (ZOFRAN) injection 4 mg (4 mg Intravenous Given 12/04/22 1949)   iohexol (OMNIPAQUE) 350 MG/ML injection 100 mL (100 mLs Intravenous Contrast Given 12/04/22 1949)  ondansetron (ZOFRAN) injection 4 mg (4 mg Intravenous Given 12/04/22 2311)  HYDROmorphone (DILAUDID) injection 1 mg (1 mg Intravenous Given 12/04/22 2312)    Mobility walks     Focused Assessments Cardiac Assessment Handoff:  Cardiac Rhythm: Sinus bradycardia No results found for: "CKTOTAL", "CKMB", "CKMBINDEX", "TROPONINI" Lab Results  Component Value Date   DDIMER 0.60 (H) 12/04/2022   Does the Patient currently have chest pain? No    R Recommendations: See Admitting Provider Note  Report given to:   Additional Notes: We attempted to take pt home but when waling her her HR jumped up to 145.  O2 was ok.  Pt pt was 40-45 HR while in bed.

## 2022-12-05 NOTE — Inpatient Diabetes Management (Signed)
Inpatient Diabetes Program Recommendations  AACE/ADA: New Consensus Statement on Inpatient Glycemic Control (2015)  Target Ranges:  Prepandial:   less than 140 mg/dL      Peak postprandial:   less than 180 mg/dL (1-2 hours)      Critically ill patients:  140 - 180 mg/dL   Lab Results  Component Value Date   GLUCAP 108 (H) 12/05/2022   HGBA1C 6.1 (H) 12/05/2022    Review of Glycemic Control  Diabetes history: DM2 Outpatient Diabetes medications: Ozempic 1 mg weekly Current orders for Inpatient glycemic control: Novolog 0-9 units TID with meals and 0-5 HS  HgbA1C - 6.1%  Inpatient Diabetes Program Recommendations:    Agree with orders.  Spoke with pt at bedside regarding her diabetes and HgbA1C of 6.1%. Pt states she hasn't taken her Ozempic in the last 2-3 months d/t insurance issues with PA. States she lost a little bit of weight on it. Pt says she drinks regular Coke 7-8 cans/day. States she will need to gradually cut back on them. Unwilling to try diet sodas. Discussed importance of weight loss in overall picture of health and improved glucose control. Discussed impact of nutrition, exercise, stress, sickness, and medications on diabetes control.  Will order Living Well with Diabetes book.  Follow-up in am.   Thank you. Ailene Ards, RD, LDN, CDCES Inpatient Diabetes Coordinator [phone]

## 2022-12-05 NOTE — TOC CM/SW Note (Signed)
  Transition of Care [facility]) Screening Note   Patient Details  Name: FAYE STROHMAN Date of Birth: [birth year]   Transition of Care [facility]) CM/SW Contact:    Howell Rucks, RN Phone Number: 12/05/2022, 4:12 PM    Transition of Care Department [facility]) has reviewed patient and no TOC needs have been identified at this time. We will continue to monitor patient advancement through interdisciplinary progression rounds. If new patient transition needs arise, please place a TOC consult.

## 2022-12-05 NOTE — Evaluation (Signed)
Occupational Therapy Evaluation Patient Details Name: Amy Boyle MRN: [id number] DOB: [birth year] Today's Date: 12/05/2022   History of Present Illness 57 y.o. female adm 5/1 with medical history significant of diabetes mellitus 2, hypertension, morbid obesity and depression with anxiety.  Patient presented with nausea and vomiting for several days.  She also reports shortness of breath with these episodes.   Clinical Impression   Patient admitted for the diagnosis above.  PTA she lives at home, continues to work, needed no assist with ADL/iADL, and walked without an AD.  Currently she presents very close to her baseline for in room mobility and ADL completion.  No acute OT needs identified, PT Consult is pending.  Encouraged mobility in the acute setting.         Recommendations for follow up therapy are one component of a multi-disciplinary discharge planning process, led by the attending physician.  Recommendations may be updated based on patient status, additional functional criteria and insurance authorization.   Assistance Recommended at Discharge None  Patient can return home with the following      Functional Status Assessment  Patient has not had a recent decline in their functional status  Equipment Recommendations  None recommended by OT    Recommendations for Other Services       Precautions / Restrictions Precautions Precautions: Fall Restrictions Weight Bearing Restrictions: No      Mobility Bed Mobility Overal bed mobility: Independent                  Transfers Overall transfer level: Independent                        Balance Overall balance assessment: No apparent balance deficits (not formally assessed)                                         ADL either performed or assessed with clinical judgement   ADL Overall ADL's : At baseline                                             Vision  Patient Visual Report: No change from baseline       Perception     Praxis      Pertinent Vitals/Pain Pain Assessment Pain Assessment: No/denies pain     Hand Dominance Right   Extremity/Trunk Assessment Upper Extremity Assessment Upper Extremity Assessment: Overall WFL for tasks assessed   Lower Extremity Assessment Lower Extremity Assessment: Defer to PT evaluation   Cervical / Trunk Assessment Cervical / Trunk Assessment: Other exceptions Cervical / Trunk Exceptions: body habitus   Communication Communication Communication: No difficulties   Cognition Arousal/Alertness: Awake/alert Behavior During Therapy: WFL for tasks assessed/performed Overall Cognitive Status: Within Functional Limits for tasks assessed                                                        Home Living Family/patient expects to be discharged to:: Private residence Living Arrangements: Alone Available Help at Discharge: Family;Available PRN/intermittently Type of Home: House Home Access: Level entry  Home Layout: One level     Bathroom Shower/Tub: Chief Strategy Officer: Standard     Home Equipment: None          Prior Functioning/Environment Prior Level of Function : Independent/Modified Independent;Working/employed;Driving                        OT Problem List: Decreased activity tolerance      OT Treatment/Interventions:      OT Goals(Current goals can be found in the care plan section) Acute Rehab OT Goals Patient Stated Goal: Hoping to return home tomorrow OT Goal Formulation: With patient Time For Goal Achievement: 12/09/22 Potential to Achieve Goals: Good  OT Frequency:      Co-evaluation              AM-PAC OT "6 Clicks" Daily Activity     Outcome Measure Help from another person eating meals?: None Help from another person taking care of personal grooming?: None Help from another person toileting, which  includes using toliet, bedpan, or urinal?: None Help from another person bathing (including washing, rinsing, drying)?: None Help from another person to put on and taking off regular upper body clothing?: None Help from another person to put on and taking off regular lower body clothing?: None 6 Click Score: 24   End of Session Nurse Communication: Mobility status  Activity Tolerance: Patient tolerated treatment well Patient left: in bed;with call bell/phone within reach  OT Visit Diagnosis: Muscle weakness (generalized) (M62.81)                Time: 2956-2130 OT Time Calculation (min): 19 min Charges:  OT General Charges $OT Visit: 1 Visit OT Evaluation $OT Eval Moderate Complexity: 1 Mod  12/05/2022  RP, OTR/L  Acute Rehabilitation Services  Office:  [phone]   Suzanna Obey 12/05/2022, 1:01 PM

## 2022-12-05 NOTE — H&P (Signed)
History and Physical    Patient: Amy Boyle:[id number] DOB: 19-May-1966 DOA: 12/04/2022 DOS: the patient was seen and examined on 12/05/2022 PCP: Molinda Bailiff, PA  Patient coming from: Home  Chief Complaint:  Chief Complaint  Patient presents with   Abdominal Pain   HPI: Amy Boyle is a 57 y.o. female with medical history significant of diabetes mellitus 2, hypertension, morbid obesity and depression with anxiety.  Patient presented to the ED with a report of nausea and vomiting for several days.  She became short of breath after having these episodes and was also experiencing dyspnea on exertion.  While in the ED treatment area she was so having some intermittent ambulatory hypoxemia as well as tachycardia with rates up into the 140s.  Patient states she has awareness of the palpitations.  She apparently was having heart rates in the 40s as well.  Her D-dimer was slightly elevated at 0.60.  CT angio of the chest was negative for PE.  Because of her GI symptoms along with some abdominal pain a CT abdomen pelvis with contrast was also obtained that showed no acute abdominal issues.  There was hepatomegaly.  An incidental finding of abnormally thickened appearance of the endometrial stripe with further evaluation by pelvic ultrasound recommended.  A transvaginal pelvic ultrasound was completed and did reveal thickened endometrium concerning for neoplasm with recommendation for hysteroscopy.  Overnight cardiology fellow was contacted regarding the intermittent bradycardia and stated that the patient was otherwise safe for discharge from cardiology perspective.  GYN oncologist consulted (Dr. Jean Rosenthal -Christell Constant who recommended outpatient GYN follow-up; in addition an additional phone call was placed to gynecologist Dr. Despina Hidden who stated the patient should follow-up in the office for a biopsy.)  The patient was updated on the ED team regarding the recommendations for outpatient follow-up for  cardiology issues and for GYN follow-up after discharge.  During ambulation the patient was noted to have a pulse oximetry reading decreased to 90% and this was associated with DOE.  On multiple occasions the patient was ambulated and was found to have heart rates in the 140s with associated dyspnea on exertion.  Hospitalist team was consulted for evaluation for admission.  Upon my evaluation patient she was sitting upright in a chair in her room.  At that time she was not having any symptoms.  She clarified that she had had multiple episodes of emesis beginning on 4/30.  She also had awareness of her palpitations which only started after she had multiple episodes of emesis which is suggestive of dehydration.  During palpitations with ambulation she was short of breath and dizzy especially when standing.  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  In further discussion with the patient she has not followed up with her primary care provider in multiple years and has not taken her Ozempic, Lopressor, or Lasix for multiple years.  She apparently has some hydrochlorothiazide which she takes for her blood pressure.  She states her primary cardiologist is Dr. Chales Abrahams in [facility] and that her PCP is Molinda Bailiff.  She does report that she has had no vaginal bleeding and confirms she is postmenopausal.  She confirms she has not been on any hormone replacement therapy.  She states she continues to check her glucose readings and states they are in the 100 range.  Since admission she has been afebrile.  Her resting heart rates have been anywhere from 45-95 with  a first-degree block notable on telemetry.  Her blood pressure was 168/87.  Her room air sats were 100% at rest.  She is currently nauseous and has required Zofran but has not had any episodes of emesis since arrival.  She has not had any chest pain.  Review of Systems: As mentioned in the history of present  illness. All other systems reviewed and are negative.  Past Medical History:  Diagnosis Date   Anxiety    Arthritis    Depression    Diabetes mellitus without complication (HCC)    Hypertension    Obesity, morbid, BMI 50 or higher (HCC)    History reviewed. No pertinent surgical history. Social History:  reports that she has never smoked. She has never used smokeless tobacco. She reports that she does not drink alcohol and does not use drugs.  No Known Allergies  History reviewed. No pertinent family history.  Prior to Admission medications   Medication Sig Start Date End Date Taking? Authorizing Provider  albuterol (VENTOLIN HFA) 108 (90 Base) MCG/ACT inhaler Inhale into the lungs. 04/29/19   [provider]  buPROPion (WELLBUTRIN XL) 300 MG 24 hr tablet Take by mouth.    [provider]  Dulaglutide (TRULICITY) 1.5 MG/0.5ML SOPN Inject into the skin. 03/23/18   [provider]  ferrous sulfate 325 (65 FE) MG tablet Take 1 tablet by mouth daily. 04/23/19   [provider]  furosemide (LASIX) 40 MG tablet Take by mouth. 11/15/19   [provider]  hydrochlorothiazide (HYDRODIURIL) 25 MG tablet Take 1 tablet by mouth daily. 04/08/18   [provider]  LORazepam (ATIVAN) 1 MG tablet Take 1 tablet (1 mg total) by mouth 3 (three) times daily as needed for anxiety. 02/14/21   Molpus, John, MD  metoprolol tartrate (LOPRESSOR) 25 MG tablet Take 1 tablet by mouth 2 (two) times daily. 01/19/20   [provider]  ondansetron (ZOFRAN) 4 MG tablet Take 1 tablet (4 mg total) by mouth every 6 (six) hours. 02/24/21   Wynetta Fines, MD  ondansetron (ZOFRAN-ODT) 8 MG disintegrating tablet 8mg  ODT q4 hours prn nausea 08/14/21   Geoffery Lyons, MD  Oxycodone HCl 10 MG TABS Take by mouth.    [provider]  PARoxetine (PAXIL) 30 MG tablet Take 1 tablet (30 mg total) by mouth daily. 02/14/21   Molpus, John, MD  potassium chloride (KLOR-CON)  10 MEQ tablet Take by mouth. 11/15/19   [provider]  topiramate (TOPAMAX) 100 MG tablet Take by mouth. 03/16/19   [provider]    Physical Exam: Vitals:   12/05/22 0246 12/05/22 0318 12/05/22 0345 12/05/22 0533  BP:  (!) 162/88  (!) 160/87  Pulse:  65  70  Resp:    20  Temp:    97.7 F (36.5 C)  TempSrc:      SpO2:    100%  Weight:   (!) 165.2 kg   Height: 5\' 6"  (1.676 m)      Constitutional: NAD, calm, comfortable sitting upright in chair in patient room Respiratory: clear to auscultation bilaterally, no wheezing, no crackles. Normal respiratory effort. No accessory muscle use.  Cardiovascular: Regular rate and rhythm, no murmurs / rubs / gallops. No extremity edema. 2+ pedal pulses.  Abdomen: no tenderness, no masses palpated.  Unable to appreciate if any hepatosplenomegaly given patient's body habitus. Bowel sounds positive.  Musculoskeletal: no clubbing / cyanosis. No joint deformity upper and lower extremities. Good ROM, no contractures. Normal  muscle tone.  Skin: no rashes, lesions, ulcers. No induration Neurologic: CN 2-12 grossly intact. Sensation intact,. Strength 5/5 x all 4 extremities.  Psychiatric: Normal judgment and insight. Alert and oriented x 3. Normal mood.     Data Reviewed:  Sodium 138, potassium 3.7, CO2 23, glucose 122, BUN 10, creatinine 0.81, LFTs are normal  WBCs 10,900 with neutrophils 72 and immature neutrophils 8, hemoglobin 10.8 with normal MCV, platelets 222,000, D-dimer 0.6  TSH 1.689  EKG normal sinus rhythm with a ventricular response of 75 bpm, QTc 464 ms, normal R wave progression, no ST or T wave changes concerning for ischemia  Follow-up EKG revealed sinus tachycardia with ventricular response 106 bpm, slightly elevated QTc of 490 ms but this is likely secondary to tachycardic rate, slightly delayed R wave progression in the septal leads and again lateral ST-T wave changes concerning for ischemia  Assessment and  Plan: Dyspnea on exertion Multifactorial-likely related to new palpitations and intermittent hypoxemia Does not occur at rest If continues to have ambulatory hypoxemia may qualify for oxygen upon discharge Treat underlying causes  Dehydration Patient with recent recurrent emesis and symptomatic palpitations that began after emesis for suspected dehydration Renal function is normal Normal saline at 100 cc/h Orthostatic vital signs showed no change in blood pressure other than standing blood pressure increasing to 222/131 but patient did develop tachycardia up to 125 bpm  Intermittent bradycardia Possibly due to vagal response Continue to monitor telemetry Check echocardiogram No chest pain so no indication to follow troponin Patient will need to follow-up with her primary cardiologist Dr. Chales Abrahams in [facility] after discharge No longer on beta-blocker  Thickened endometrium and recent vaginal bleeding Patient is is postmenopausal and symptoms are concerning for possible neoplasm EDP discussed case with GYN on-call GYN and recommendation is for patient to follow-up with gynecology in the outpatient setting to undergo biopsy Patient is not on hormone replacement therapy Had discussion with patient regarding the importance of following up with GYN.  Patient states she does not have an established gynecologist and has not had a Pap smear since 2021  Diabetes mellitus 2 Patient previously on Ozempic but has not taken since 2021 or 2022; apparently she called her PCP office and was told she needed a prior authorization before receiving Ozempic prescription She reports her sugars are in the 100 range when she checks According to her she does not take anything else for her diabetes Check hemoglobin A1c and lipid panel I did discuss with the patient that if her A1c was significantly elevated she would need to discharge home on long-acting insulin Follow CBGs and provide SSI while  hospitalized Patient demonstrated an adequate understanding about diabetes disease process as well as diet management therefore diabetes educator consultation as well as nutrition consultation for teaching regarding diabetic diet ordered  Hypertension Has only been taking hydrochlorothiazide for blood pressure management Current blood pressure elevated Begin Norvasc 5 mg daily-can adjust medications based on 2D echocardiogram as above Patient reportedly has a history of congestive heart failure but I was unable to locate documentation of this or an echocardiogram upon my review of care everywhere documentation  Morbid obesity BMI 58.78 Lengthy discussion with patient regarding weight control measures Discussed that diabetes management and appropriate glucose control is one of the best measures to begin weight management.  Explained the physiology of poorly controlled diabetes and inadequate use of circulating glucose in context of insulin resistance and how that increases hunger and increase his oral intake  of food Discussed with her that if she takes the GLP-1 she must pay attention to the signals this medication is giving in regards to not over eating.     Advance Care Planning:   Code Status: Full Code   VTE prophylaxis: Lovenox  Consults: EDP telephonic consult with cardiology, GYN and OncoGYN  Family Communication: Patient only  Severity of Illness: The appropriate patient status for this patient is OBSERVATION. Observation status is judged to be reasonable and necessary in order to provide the required intensity of service to ensure the patient's safety. The patient's presenting symptoms, physical exam findings, and initial radiographic and laboratory data in the context of their medical condition is felt to place them at decreased risk for further clinical deterioration. Furthermore, it is anticipated that the patient will be medically stable for discharge from the hospital within 2  midnights of admission.   Author: Junious Silk, NP 12/05/2022 9:18 AM  For on call review [email or web address].

## 2022-12-05 NOTE — Progress Notes (Signed)
MD updated via phone Patient sitting on side of bed and with c/o severe stabbing abd pain and nausea. Patient attempting to throw up only clear secretions brought up. New orders received

## 2022-12-05 NOTE — Progress Notes (Signed)
Carryover admission to the Day Admitter; accepted by Dr.  Antionette Char as transfer from  [facility]  to a  med-tele bed at  [facility]  for  generalized weakness and DOE. Please see Dr.  Francesco Runner transfer documentation or additional details.   I have placed some additional preliminary admit orders via the adult multi-morbid admission order set. I have also ordered morning labs in the form of CMP, CBC, and serum magnesium level.  For her presenting generalized weakness, I have also ordered physical therapy and Occupational Therapy consults to start in the morning.    Newton Pigg, DO Hospitalist

## 2022-12-05 NOTE — Plan of Care (Signed)
  Problem: Activity: Goal: Risk for activity intolerance will decrease Outcome: Progressing   Problem: Nutrition: Goal: Adequate nutrition will be maintained Outcome: Progressing   

## 2022-12-05 NOTE — Progress Notes (Signed)
SATURATION QUALIFICATIONS: (This note is used to comply with regulatory documentation for home oxygen)  Patient Saturations on Room Air at Rest = 99%  Patient Saturations on Room Air while Ambulating = 100%  

## 2022-12-05 NOTE — Progress Notes (Signed)
Patient ambulated in there hallway Room Air O2 Saturations 99-100%. Tolerated activity well

## 2022-12-06 ENCOUNTER — Inpatient Hospital Stay (HOSPITAL_COMMUNITY): Payer: PRIVATE HEALTH INSURANCE

## 2022-12-06 DIAGNOSIS — K760 Fatty (change of) liver, not elsewhere classified: Secondary | ICD-10-CM | POA: Diagnosis present

## 2022-12-06 DIAGNOSIS — E119 Type 2 diabetes mellitus without complications: Secondary | ICD-10-CM | POA: Diagnosis not present

## 2022-12-06 DIAGNOSIS — E860 Dehydration: Secondary | ICD-10-CM | POA: Diagnosis present

## 2022-12-06 DIAGNOSIS — D638 Anemia in other chronic diseases classified elsewhere: Secondary | ICD-10-CM | POA: Diagnosis present

## 2022-12-06 DIAGNOSIS — I5033 Acute on chronic diastolic (congestive) heart failure: Secondary | ICD-10-CM | POA: Diagnosis not present

## 2022-12-06 DIAGNOSIS — Z6841 Body Mass Index (BMI) 40.0 and over, adult: Secondary | ICD-10-CM | POA: Diagnosis not present

## 2022-12-06 DIAGNOSIS — F419 Anxiety disorder, unspecified: Secondary | ICD-10-CM | POA: Diagnosis present

## 2022-12-06 DIAGNOSIS — R0902 Hypoxemia: Secondary | ICD-10-CM | POA: Diagnosis present

## 2022-12-06 DIAGNOSIS — E662 Morbid (severe) obesity with alveolar hypoventilation: Secondary | ICD-10-CM | POA: Diagnosis present

## 2022-12-06 DIAGNOSIS — T501X6A Underdosing of loop [high-ceiling] diuretics, initial encounter: Secondary | ICD-10-CM | POA: Diagnosis present

## 2022-12-06 DIAGNOSIS — T383X6A Underdosing of insulin and oral hypoglycemic [antidiabetic] drugs, initial encounter: Secondary | ICD-10-CM | POA: Diagnosis present

## 2022-12-06 DIAGNOSIS — R001 Bradycardia, unspecified: Secondary | ICD-10-CM | POA: Diagnosis present

## 2022-12-06 DIAGNOSIS — R1031 Right lower quadrant pain: Secondary | ICD-10-CM | POA: Diagnosis not present

## 2022-12-06 DIAGNOSIS — T447X6A Underdosing of beta-adrenoreceptor antagonists, initial encounter: Secondary | ICD-10-CM | POA: Diagnosis present

## 2022-12-06 DIAGNOSIS — R160 Hepatomegaly, not elsewhere classified: Secondary | ICD-10-CM | POA: Diagnosis present

## 2022-12-06 DIAGNOSIS — K2900 Acute gastritis without bleeding: Secondary | ICD-10-CM | POA: Diagnosis present

## 2022-12-06 DIAGNOSIS — R1032 Left lower quadrant pain: Secondary | ICD-10-CM | POA: Diagnosis not present

## 2022-12-06 DIAGNOSIS — R000 Tachycardia, unspecified: Secondary | ICD-10-CM | POA: Diagnosis present

## 2022-12-06 DIAGNOSIS — N939 Abnormal uterine and vaginal bleeding, unspecified: Secondary | ICD-10-CM | POA: Diagnosis present

## 2022-12-06 DIAGNOSIS — R1084 Generalized abdominal pain: Secondary | ICD-10-CM | POA: Diagnosis present

## 2022-12-06 DIAGNOSIS — R109 Unspecified abdominal pain: Secondary | ICD-10-CM | POA: Diagnosis present

## 2022-12-06 DIAGNOSIS — F32A Depression, unspecified: Secondary | ICD-10-CM | POA: Diagnosis present

## 2022-12-06 DIAGNOSIS — Z23 Encounter for immunization: Secondary | ICD-10-CM | POA: Diagnosis not present

## 2022-12-06 DIAGNOSIS — R531 Weakness: Secondary | ICD-10-CM | POA: Diagnosis present

## 2022-12-06 DIAGNOSIS — I10 Essential (primary) hypertension: Secondary | ICD-10-CM | POA: Diagnosis not present

## 2022-12-06 DIAGNOSIS — I110 Hypertensive heart disease with heart failure: Secondary | ICD-10-CM | POA: Diagnosis present

## 2022-12-06 DIAGNOSIS — R0609 Other forms of dyspnea: Secondary | ICD-10-CM | POA: Diagnosis not present

## 2022-12-06 DIAGNOSIS — Z91138 Patient's unintentional underdosing of medication regimen for other reason: Secondary | ICD-10-CM | POA: Diagnosis not present

## 2022-12-06 DIAGNOSIS — R9389 Abnormal findings on diagnostic imaging of other specified body structures: Secondary | ICD-10-CM | POA: Diagnosis present

## 2022-12-06 DIAGNOSIS — N950 Postmenopausal bleeding: Secondary | ICD-10-CM | POA: Diagnosis present

## 2022-12-06 DIAGNOSIS — E1165 Type 2 diabetes mellitus with hyperglycemia: Secondary | ICD-10-CM | POA: Diagnosis present

## 2022-12-06 LAB — LIPID PANEL
Cholesterol: 187 mg/dL (ref 0–200)
HDL: 44 mg/dL (ref >40)
LDL Cholesterol: 126 mg/dL — ABNORMAL HIGH (ref 0–99)
Total CHOL/HDL Ratio: 4.3 RATIO
Triglycerides: 85 mg/dL (ref <150)
VLDL: 17 mg/dL (ref 0–40)

## 2022-12-06 LAB — COMPREHENSIVE METABOLIC PANEL
ALT: 16 U/L (ref 0–44)
AST: 24 U/L (ref 15–41)
Albumin: 3.7 g/dL (ref 3.5–5.0)
Alkaline Phosphatase: 54 U/L (ref 38–126)
Anion gap: 11 (ref 5–15)
BUN: 8 mg/dL (ref 6–20)
CO2: 24 mmol/L (ref 22–32)
Calcium: 8.7 mg/dL — ABNORMAL LOW (ref 8.9–10.3)
Chloride: 100 mmol/L (ref 98–111)
Creatinine, Ser: 0.82 mg/dL (ref 0.44–1.00)
GFR, Estimated: >60 mL/min (ref >60)
Glucose, Bld: 129 mg/dL — ABNORMAL HIGH (ref 70–99)
Potassium: 3.5 mmol/L (ref 3.5–5.1)
Sodium: 135 mmol/L (ref 135–145)
Total Bilirubin: 0.8 mg/dL (ref 0.3–1.2)
Total Protein: 7.6 g/dL (ref 6.5–8.1)

## 2022-12-06 LAB — GLUCOSE, CAPILLARY
Glucose-Capillary: 110 mg/dL — ABNORMAL HIGH (ref 70–99)
Glucose-Capillary: 122 mg/dL — ABNORMAL HIGH (ref 70–99)
Glucose-Capillary: 141 mg/dL — ABNORMAL HIGH (ref 70–99)
Glucose-Capillary: 139 mg/dL — ABNORMAL HIGH (ref 70–99)

## 2022-12-06 LAB — MAGNESIUM: Magnesium: 2.4 mg/dL (ref 1.7–2.4)

## 2022-12-06 MED ORDER — DICYCLOMINE HCL 10 MG PO CAPS
10.0000 mg | ORAL_CAPSULE | Freq: Three times a day (TID) | ORAL | Status: DC
  Administered 2022-12-06 – 2022-12-07 (×3): 10 mg via ORAL
  Filled 2022-12-06 (×3): qty 1

## 2022-12-06 MED ORDER — FUROSEMIDE 10 MG/ML IJ SOLN
40.0000 mg | Freq: Two times a day (BID) | INTRAMUSCULAR | Status: DC
  Administered 2022-12-06 – 2022-12-07 (×2): 40 mg via INTRAVENOUS
  Filled 2022-12-06 (×2): qty 4

## 2022-12-06 MED ORDER — HYDROCHLOROTHIAZIDE 25 MG PO TABS
25.0000 mg | ORAL_TABLET | Freq: Every day | ORAL | Status: DC
  Administered 2022-12-06: 25 mg via ORAL
  Filled 2022-12-06: qty 1

## 2022-12-06 NOTE — Progress Notes (Signed)
PROGRESS NOTE    Amy Boyle  YNW:[id number] DOB: Aug 10, 1965 DOA: 12/04/2022 PCP: Molinda Bailiff, PA   Brief Narrative:  57 year old female with history of diabetes mellitus type 2, hypertension, morbid obesity, depression and anxiety and noncompliant to some of her medications presented with abdominal pain, vaginal bleeding and worsening shortness of breath.  On presentation, renal function was stable. CT angio of chest was negative for PE. CT abdomen and pelvis with contrast showed no acute abdominal issues. Pelvic ultrasound revealed thickened endometrium concerning for neoplasm. ED provider communicated with on-call GYN who recommended outpatient follow-up.  She was started on IV analgesics and fluids.  Assessment & Plan:   Abdominal pain -Questionable cause.  Still complains of severe intermittent abdominal pain and not ready for discharge today.  CT of the abdomen and pelvis was negative for any acute abnormality -Continue PPI IV twice daily.  Continue as needed Maalox.  Add Bentyl. -Check right upper quadrant ultrasound  Dyspnea on exertion -Possibly multifactorial related to morbid obesity, possible obesity hypoventilation syndrome and might have underlying diastolic CHF as well -CTA was negative for PE.  Echo showed EF of 70 to 75% and grade 1 diastolic dysfunction.  Was treated with IV fluids on admission but subsequently discontinued. -Currently on room air.  Strict input and output.  Daily weights.  Fluid restriction.  DC hydrochlorothiazide.  Start Lasix 40 mg IV every 12 hours.  Dehydration -Resolved.  Diuretic plan as above  Hypertension -Blood pressure intermittently elevated.  Continue amlodipine.  Lasix plan as above  Intermittent bradycardia -Possibly due to vagal response.  Resolved.  Echo as above.  Continue telemetry monitoring  Anemia of chronic disease/normocytic anemia -From chronic illnesses.  Hemoglobin stable.  Thickened endometrium and recent vaginal  bleeding -Pelvic ultrasound revealed thickened endometrium concerning for neoplasm. ED provider communicated with on-call GYN who recommended outpatient follow-up.  -Patient continues to have vaginal bleeding with some clots.  Will reach out to GYN again.  Diabetes mellitus type 2 -Not on any meds as an outpatient.  Carb modified diet.  A1c 6.1.  Blood sugars controlled.  Continue CBGs with SSI  Morbid obesity -Outpatient follow-up   DVT prophylaxis: DC Lovenox because of vaginal bleeding.  Start SCDs. Code Status: Full Family Communication: None at bedside Disposition Plan: Status is: Observation The patient will require care spanning > 2 midnights and should be moved to inpatient because: Of severity of illness.  Consultants: ED provider communicated with on-call GYN  Procedures: None  Antimicrobials: None   Subjective: Patient seen and examined at bedside.  Does not feel well and continues to have abdominal pain and cramps with vaginal bleeding and some clots.  No fever or vomiting reported.  Does not feel ready to go home today. Objective: Vitals:   12/05/22 1423 12/05/22 1748 12/06/22 0547 12/06/22 0808  BP: (!) 171/85 (!) 162/80 (!) 204/84 (!) 154/94  Pulse: 75 74 65 86  Resp: 18  (!) 22 20  Temp: 99.1 F (37.3 C) 98.4 F (36.9 C) 99.3 F (37.4 C) 99.2 F (37.3 C)  TempSrc: Oral Oral Oral Oral  SpO2: 100% 100% 97% 99%  Weight:      Height:        Intake/Output Summary (Last 24 hours) at 12/06/2022 1124 Last data filed at 12/05/2022 1900 Gross per 24 hour  Intake 1020.73 ml  Output --  Net 1020.73 ml   Filed Weights   12/04/22 1108 12/05/22 0345  Weight: (!) 163.3 kg (!) 165.2 kg  Examination:  General exam: Appears calm and comfortable.  Looks chronically ill and deconditioned but currently on room air. Respiratory system: Bilateral decreased breath sounds at bases with some scattered crackles.  Intermittently tachypneic Cardiovascular system: S1 & S2  heard, Rate controlled Gastrointestinal system: Abdomen is distended soft and tender in the upper quadrant.  Normal bowel sounds heard. Extremities: No cyanosis, clubbing; bilateral lower extremity edema present Central nervous system: Alert and oriented. No focal neurological deficits. Moving extremities Skin: No rashes, lesions or ulcers Psychiatry: Flat affect.  Not agitated.    Data Reviewed: I have personally reviewed following labs and imaging studies  CBC: Recent Labs  Lab 12/04/22 1113 12/05/22 0558  WBC 9.1 10.9*  NEUTROABS  --  8.0*  HGB 10.9* 10.8*  HCT 35.0* 35.1*  MCV 81.6 81.3  PLT 265 222   Basic Metabolic Panel: Recent Labs  Lab 12/04/22 1113 12/05/22 0558 12/06/22 0537  NA 135 138 135  K 3.5 3.7 3.5  CL 102 102 100  CO2 20* 23 24  GLUCOSE 138* 122* 129*  BUN 10 10 8   CREATININE 0.80 0.81 0.82  CALCIUM 9.0 9.5 8.7*  MG 2.1 2.1 2.4   GFR: Estimated Creatinine Clearance: 118.6 mL/min (by C-G formula based on SCr of 0.82 mg/dL). Liver Function Tests: Recent Labs  Lab 12/04/22 1113 12/05/22 0558 12/06/22 0537  AST 20 23 24   ALT 14 15 16   ALKPHOS 66 59 54  BILITOT 0.4 0.9 0.8  PROT 8.2* 7.8 7.6  ALBUMIN 4.0 3.7 3.7   Recent Labs  Lab 12/04/22 1113  LIPASE 26   No results for input(s): "AMMONIA" in the last 168 hours. Coagulation Profile: No results for input(s): "INR", "PROTIME" in the last 168 hours. Cardiac Enzymes: No results for input(s): "CKTOTAL", "CKMB", "CKMBINDEX", "TROPONINI" in the last 168 hours. BNP (last 3 results) No results for input(s): "PROBNP" in the last 8760 hours. HbA1C: Recent Labs    12/05/22 0902  HGBA1C 6.1*   CBG: Recent Labs  Lab 12/05/22 0739 12/05/22 1121 12/05/22 1624 12/05/22 2048 12/06/22 0725  GLUCAP 100* 108* 98 142* 110*   Lipid Profile: Recent Labs    12/06/22 0537  CHOL 187  HDL 44  LDLCALC 126*  TRIG 85  CHOLHDL 4.3   Thyroid Function Tests: Recent Labs    12/04/22 1553   TSH 1.689   Anemia Panel: No results for input(s): "VITAMINB12", "FOLATE", "FERRITIN", "TIBC", "IRON", "RETICCTPCT" in the last 72 hours. Sepsis Labs: No results for input(s): "PROCALCITON", "LATICACIDVEN" in the last 168 hours.  No results found for this or any previous visit (from the past 240 hour(s)).       Radiology Studies: ECHOCARDIOGRAM COMPLETE  Result Date: 12/05/2022    ECHOCARDIOGRAM REPORT   Patient Name:   ORCHID SCHMEISER Date of Exam: 12/05/2022 Medical Rec #:  [id number]          Height:       66.0 in Accession #:    [id number]         Weight:       364.2 lb Date of Birth:  06-Jul-1966           BSA:          2.580 m Patient Age:    57 years           BP:           183/78 mmHg Patient Gender: F  HR:           69 bpm. Exam Location:  Inpatient Procedure: 2D Echo, Color Doppler, Cardiac Doppler and Intracardiac            Opacification Agent Indications:    Dyspnea  History:        Patient has no prior history of Echocardiogram examinations.                 Signs/Symptoms:Dyspnea; Risk Factors:Diabetes, Morbid Obesity                 and Hypertension.  Sonographer:    Milbert Coulter Referring Phys: [id number] Russella Dar  Sonographer Comments: Patient is obese. Image acquisition challenging due to patient body habitus. IMPRESSIONS  1. Technically difficult study with suboptimal echo windows, Definity image enhancement given  2. Left ventricular ejection fraction, by estimation, is 70 to 75%. Left ventricular ejection fraction by PLAX is 75 %. The left ventricle has hyperdynamic function. The left ventricle has no regional wall motion abnormalities. There is moderate left ventricular hypertrophy. Left ventricular diastolic parameters are consistent with Grade I diastolic dysfunction (impaired relaxation).  3. Right ventricular systolic function is hyperdynamic. The right ventricular size is normal.  4. The mitral valve was not well visualized. No evidence of mitral valve  regurgitation.  5. The aortic valve was not well visualized. Aortic valve regurgitation is not visualized.  6. Increased flow velocities may be secondary to anemia, thyrotoxicosis, hyperdynamic or high flow state. Comparison(s): No prior Echocardiogram. FINDINGS  Left Ventricle: Left ventricular ejection fraction, by estimation, is 70 to 75%. Left ventricular ejection fraction by PLAX is 75 %. The left ventricle has hyperdynamic function. The left ventricle has no regional wall motion abnormalities. Definity contrast agent was given IV to delineate the left ventricular endocardial borders. The left ventricular internal cavity size was normal in size. There is moderate left ventricular hypertrophy. Left ventricular diastolic parameters are consistent with Grade I diastolic dysfunction (impaired relaxation). Indeterminate filling pressures. Right Ventricle: The right ventricular size is normal. No increase in right ventricular wall thickness. Right ventricular systolic function is hyperdynamic. Left Atrium: Left atrial size was normal in size. Right Atrium: Right atrial size was normal in size. Pericardium: There is no evidence of pericardial effusion. Mitral Valve: The mitral valve was not well visualized. No evidence of mitral valve regurgitation. Tricuspid Valve: The tricuspid valve is grossly normal. Tricuspid valve regurgitation is mild. Aortic Valve: The aortic valve was not well visualized. Aortic valve regurgitation is not visualized. Aortic valve mean gradient measures 9.0 mmHg. Aortic valve peak gradient measures 16.3 mmHg. Aortic valve area, by VTI measures 2.90 cm. Pulmonic Valve: The pulmonic valve was not well visualized. Pulmonic valve regurgitation is not visualized. Aorta: The aortic root and ascending aorta are structurally normal, with no evidence of dilitation. Venous: The inferior vena cava was not well visualized. IAS/Shunts: No atrial level shunt detected by color flow Doppler.  LEFT VENTRICLE  PLAX 2D LV EF:         Left            Diastology                ventricular     LV e' medial:    7.18 cm/s                ejection        LV E/e' medial:  13.2  fraction by     LV e' lateral:   9.57 cm/s                PLAX is 75      LV E/e' lateral: 9.9                %. LVIDd:         4.60 cm LVIDs:         2.60 cm LV PW:         1.50 cm LV IVS:        1.50 cm LVOT diam:     2.20 cm LV SV:         130 LV SV Index:   50 LVOT Area:     3.80 cm  RIGHT VENTRICLE RV S prime:     19.80 cm/s TAPSE (M-mode): 2.8 cm LEFT ATRIUM             Index        RIGHT ATRIUM           Index LA diam:        3.80 cm 1.47 cm/m   RA Area:     17.20 cm LA Vol (A2C):   79.1 ml 30.66 ml/m  RA Volume:   50.70 ml  19.65 ml/m LA Vol (A4C):   55.1 ml 21.36 ml/m LA Biplane Vol: 66.9 ml 25.93 ml/m  AORTIC VALVE AV Area (Vmax):    2.48 cm AV Area (Vmean):   2.63 cm AV Area (VTI):     2.90 cm AV Vmax:           202.00 cm/s AV Vmean:          146.000 cm/s AV VTI:            0.449 m AV Peak Grad:      16.3 mmHg AV Mean Grad:      9.0 mmHg LVOT Vmax:         132.00 cm/s LVOT Vmean:        101.000 cm/s LVOT VTI:          0.342 m LVOT/AV VTI ratio: 0.76 MITRAL VALVE               TRICUSPID VALVE MV Area (PHT): 3.21 cm    TR Peak grad:   25.2 mmHg MV Decel Time: 236 msec    TR Vmax:        251.00 cm/s MV E velocity: 95.10 cm/s MV A velocity: 80.00 cm/s  SHUNTS MV E/A ratio:  1.19        Systemic VTI:  0.34 m                            Systemic Diam: 2.20 cm Zoila Shutter MD Electronically signed by Zoila Shutter MD Signature Date/Time: 12/05/2022/11:32:21 AM    Final    CT Angio Chest PE W and/or Wo Contrast  Result Date: 12/04/2022 CLINICAL DATA:  Dyspnea. Elevated D-dimer. Intermediate probability for pulmonary embolism. EXAM: CT ANGIOGRAPHY CHEST WITH CONTRAST TECHNIQUE: Multidetector CT imaging of the chest was performed using the standard protocol during bolus administration of intravenous contrast. Multiplanar CT image  reconstructions and MIPs were obtained to evaluate the vascular anatomy. RADIATION DOSE REDUCTION: This exam was performed according to the departmental dose-optimization program which includes automated exposure control, adjustment of the mA and/or kV according to patient size and/or use of iterative reconstruction technique.  CONTRAST:  OMNIPAQUE IOHEXOL 350 MG/ML SOLN COMPARISON:  None Available. FINDINGS: Cardiovascular: Satisfactory opacification of pulmonary arteries noted. Technically suboptimal exam due to significant beam hardening artifact from position of patient's left arm. No pulmonary emboli identified. No evidence of thoracic aortic dissection or aneurysm. Mediastinum/Nodes: No masses or pathologically enlarged lymph nodes identified. Lungs/Pleura: No pulmonary mass, infiltrate, or effusion. Upper abdomen: No acute findings. Musculoskeletal: No suspicious bone lesions identified. Review of the MIP images confirms the above findings. IMPRESSION: No evidence of pulmonary embolism or other active disease. Electronically Signed   By: Danae Orleans M.D.   On: 12/04/2022 20:07   US PELVIC COMPLETE WITH TRANSVAGINAL  Result Date: 12/04/2022 CLINICAL DATA:  Endometrial thickening seen on CT. EXAM: TRANSABDOMINAL AND TRANSVAGINAL ULTRASOUND OF PELVIS TECHNIQUE: Both transabdominal and transvaginal ultrasound examinations of the pelvis were performed. Transabdominal technique was performed for global imaging of the pelvis including uterus, ovaries, adnexal regions, and pelvic cul-de-sac. It was necessary to proceed with endovaginal exam following the transabdominal exam to visualize the endometrium and ovaries. COMPARISON:  CT abdomen pelvis dated 12/04/2022. FINDINGS: Evaluation is limited due to body habitus. Uterus Measurements: 10.5 x 6.2 x 7.3 cm = volume: 250 mL. No fibroids or other mass visualized. Endometrium Thickness: 3.7 cm. The endometrium is thickened with internal vascularity. Findings may  represent endometrial hyperplasia or polyps but concerning for neoplasm. Further evaluation with hysteroscopy is recommended. Right ovary Not visualized. Left ovary Not visualized. Other findings No abnormal free fluid. IMPRESSION: Thickened endometrium concerning for neoplasm. Further evaluation with hysteroscopy is recommended. Electronically Signed   By: Elgie Collard M.D.   On: 12/04/2022 15:48   DG Chest 2 View  Result Date: 12/04/2022 CLINICAL DATA:  Shortness of breath EXAM: CHEST - 2 VIEW COMPARISON:  X-ray 02/24/2021 and older FINDINGS: Enlarged cardiopericardial silhouette. Vascular congestion with trace edema. No pneumothorax or effusion. No consolidation. Degenerative changes of the spine. Overlapping cardiac leads IMPRESSION: Enlarged cardiopericardial silhouette with some vascular congestion. Electronically Signed   By: Karen Kays M.D.   On: 12/04/2022 13:30   CT ABDOMEN PELVIS W CONTRAST  Result Date: 12/04/2022 CLINICAL DATA:  Abdominal pain, acute, nonlocalized EXAM: CT ABDOMEN AND PELVIS WITH CONTRAST TECHNIQUE: Multidetector CT imaging of the abdomen and pelvis was performed using the standard protocol following bolus administration of intravenous contrast. RADIATION DOSE REDUCTION: This exam was performed according to the departmental dose-optimization program which includes automated exposure control, adjustment of the mA and/or kV according to patient size and/or use of iterative reconstruction technique. CONTRAST:  OMNIPAQUE IOHEXOL 300 MG/ML  SOLN COMPARISON:  08/13/2021 FINDINGS: Technical note: Examination is degraded by beam hardening and photon starvation secondary to patient body habitus. Lower chest: No acute abnormality. Hepatobiliary: Liver measures 23 cm in length. No focal liver lesion is identified. Unremarkable gallbladder. No hyperdense gallstone. No biliary dilatation. Pancreas: Unremarkable. No pancreatic ductal dilatation or surrounding inflammatory changes.  Spleen: Normal in size without focal abnormality. Adrenals/Urinary Tract: Adrenal glands are unremarkable. Kidneys are grossly unremarkable without renal calculi, focal lesion, or hydronephrosis. Bladder is unremarkable. Stomach/Bowel: Small hiatal hernia. Stomach is otherwise within normal limits. No evidence of appendicitis. No evidence of bowel wall thickening, distention, or inflammatory changes. Vascular/Lymphatic: No significant vascular findings are present. No enlarged abdominal or pelvic lymph nodes. Reproductive: Anteverted uterus. Abnormally thickened appearance of the endometrial stripe measuring approximately 3.7 cm (series 7, image 78). No adnexal masses. Other: No free fluid. No abdominopelvic fluid collection. No pneumoperitoneum. Small fat containing  umbilical hernia. Musculoskeletal: No acute or significant osseous findings. IMPRESSION: 1. No acute abdominopelvic findings. 2. Abnormally thickened appearance of the endometrial stripe. Further evaluation with pelvic ultrasound is recommended. 3. Hepatomegaly. 4. Small hiatal hernia. Electronically Signed   By: Duanne Guess D.O.   On: 12/04/2022 13:27        Scheduled Meds:  amLODipine  5 mg Oral Daily   hydrochlorothiazide  25 mg Oral Daily   insulin aspart  0-5 Units Subcutaneous QHS   insulin aspart  0-9 Units Subcutaneous TID WC   ondansetron (ZOFRAN) IV  4 mg Intravenous Q6H   pantoprazole (PROTONIX) IV  40 mg Intravenous Q12H   senna-docusate  1 tablet Oral BID   sodium chloride flush  3 mL Intravenous Q12H   Continuous Infusions:        Glade Lloyd, MD [facility] 12/06/2022, 11:24 AM

## 2022-12-07 ENCOUNTER — Emergency Department (HOSPITAL_BASED_OUTPATIENT_CLINIC_OR_DEPARTMENT_OTHER)
Admission: EM | Admit: 2022-12-07 | Discharge: 2022-12-07 | Disposition: A | Discharge status: Home / Self Care | Payer: No Typology Code available for payment source | Attending: Emergency Medicine | Admitting: Emergency Medicine

## 2022-12-07 ENCOUNTER — Other Ambulatory Visit: Payer: Self-pay

## 2022-12-07 ENCOUNTER — Encounter (HOSPITAL_BASED_OUTPATIENT_CLINIC_OR_DEPARTMENT_OTHER): Payer: Self-pay

## 2022-12-07 DIAGNOSIS — E119 Type 2 diabetes mellitus without complications: Secondary | ICD-10-CM | POA: Diagnosis not present

## 2022-12-07 DIAGNOSIS — I10 Essential (primary) hypertension: Secondary | ICD-10-CM | POA: Diagnosis not present

## 2022-12-07 DIAGNOSIS — R0609 Other forms of dyspnea: Secondary | ICD-10-CM | POA: Diagnosis not present

## 2022-12-07 DIAGNOSIS — R1031 Right lower quadrant pain: Secondary | ICD-10-CM | POA: Diagnosis not present

## 2022-12-07 DIAGNOSIS — N939 Abnormal uterine and vaginal bleeding, unspecified: Secondary | ICD-10-CM | POA: Insufficient documentation

## 2022-12-07 DIAGNOSIS — R1032 Left lower quadrant pain: Secondary | ICD-10-CM | POA: Diagnosis not present

## 2022-12-07 LAB — GLUCOSE, CAPILLARY: Glucose-Capillary: 121 mg/dL — ABNORMAL HIGH (ref 70–99)

## 2022-12-07 MED ORDER — DICYCLOMINE HCL 10 MG PO CAPS: 10.0000 mg | ORAL_CAPSULE | Freq: Three times a day (TID) | ORAL | 1 refills | Status: AC

## 2022-12-07 MED ORDER — AMLODIPINE BESYLATE 5 MG PO TABS: 5.0000 mg | ORAL_TABLET | Freq: Every day | ORAL | 0 refills | Status: AC

## 2022-12-07 MED ORDER — OXYCODONE HCL 10 MG PO TABS: 10.0000 mg | ORAL_TABLET | Freq: Four times a day (QID) | ORAL | 0 refills | Status: AC | PRN

## 2022-12-07 MED ORDER — ALUM & MAG HYDROXIDE-SIMETH 200-200-20 MG/5ML PO SUSP: 30.0000 mL | Freq: Four times a day (QID) | ORAL | 1 refills | Status: AC | PRN

## 2022-12-07 MED ORDER — ONDANSETRON HCL 4 MG PO TABS: 4.0000 mg | ORAL_TABLET | Freq: Three times a day (TID) | ORAL | 0 refills | Status: AC | PRN

## 2022-12-07 MED ORDER — OXYCODONE HCL 5 MG PO TABS
10.0000 mg | ORAL_TABLET | Freq: Once | ORAL | Status: AC
  Administered 2022-12-07: 10 mg via ORAL
  Filled 2022-12-07: qty 2

## 2022-12-07 MED ORDER — PANTOPRAZOLE SODIUM 40 MG PO TBEC: 40.0000 mg | DELAYED_RELEASE_TABLET | Freq: Two times a day (BID) | ORAL | 0 refills | Status: DC

## 2022-12-07 MED ORDER — MUSCLE RUB 10-15 % EX CREA
TOPICAL_CREAM | CUTANEOUS | Status: DC | PRN
  Filled 2022-12-07: qty 85

## 2022-12-07 MED ORDER — FUROSEMIDE 40 MG PO TABS: 40.0000 mg | ORAL_TABLET | Freq: Two times a day (BID) | ORAL | 0 refills | Status: AC

## 2022-12-07 MED ORDER — SENNOSIDES-DOCUSATE SODIUM 8.6-50 MG PO TABS: 1.0000 | ORAL_TABLET | Freq: Two times a day (BID) | ORAL | 0 refills | Status: AC

## 2022-12-07 MED ORDER — POLYETHYLENE GLYCOL 3350 17 G PO PACK: 17.0000 g | PACK | Freq: Every day | ORAL | 0 refills | Status: AC | PRN

## 2022-12-07 NOTE — Discharge Summary (Signed)
Physician Discharge Summary  MADDIGAN LEGLEITER ZOX:[id number] DOB: [birth year] DOA: 12/04/2022  PCP: Molinda Bailiff, PA  Admit date: 12/04/2022 Discharge date: 12/07/2022  Admitted From: Home Disposition: Home  Recommendations for Outpatient Follow-up:  Follow up with PCP in 1 week with repeat CBC/BMP Outpatient evaluation and follow-up by GYN Outpatient evaluation and follow-up by cardiology. Follow up in ED if symptoms worsen or new appear   Home Health: No Equipment/Devices: None  Discharge Condition: Stable CODE STATUS: Full Diet recommendation: Heart healthy/fluid restriction of up to 1500 cc a day since carb modified diet  Brief/Interim Summary: 57 year old female with history of diabetes mellitus type 2, hypertension, morbid obesity, depression and anxiety and noncompliant to some of her medications presented with abdominal pain, vaginal bleeding and worsening shortness of breath.  On presentation, renal function was stable. CT angio of chest was negative for PE. CT abdomen and pelvis with contrast showed no acute abdominal issues. Pelvic ultrasound revealed thickened endometrium concerning for neoplasm. ED provider communicated with on-call GYN who recommended outpatient follow-up.  She was started on IV analgesics and fluids.  During the hospitalization, her condition has gradually improved.  IV fluids have been discontinued.  She was started on IV Lasix.  She is diuresing well.  Also treated with IV PPI twice daily and oral Bentyl.  Abdominal pain has much improved and dyspnea is improving.  She feels better to go home today.  She will be discharged home today on oral PPI and Lasix.  Outpatient follow-up with PCP.  Discharge Diagnoses:   Abdominal pain Probable acute gastritis -Questionable cause.  CT of the abdomen and pelvis was negative for any acute abnormality -Treated with PPI IV twice daily and oral Bentyl.  Also on as needed Maalox. -right upper quadrant ultrasound showed  hepatic steatosis. -Abdominal pain has improved.  Tolerating diet.  Feels okay to go home today. -Discharge patient home today on oral PPI twice a day along with oral Bentyl and as needed Maalox.  Outpatient follow-up with PCP.  Might need GI evaluation as an outpatient if symptoms continue   Dyspnea on exertion Possible acute diastolic heart failure -Possibly multifactorial related to morbid obesity, possible obesity hypoventilation syndrome and might have underlying diastolic CHF as well -CTA was negative for PE.  Echo showed EF of 70 to 75% and grade 1 diastolic dysfunction.  Was treated with IV fluids on admission but subsequently discontinued. -Started on IV Lasix every 12 hours.  Dyspnea and leg swelling improving.  Hydrochlorothiazide discontinued.  Continue Lasix 40 mg twice a day on discharge till evaluation by PCP.  Recommend outpatient evaluation and follow-up by cardiology.  Dehydration -Resolved.  Diuretic plan as above   Hypertension -Blood pressure intermittently elevated.  Continue amlodipine which was started during the hospitalization.  Lasix and hydrochlorothiazide plan as above   Intermittent bradycardia -Possibly due to vagal response.  Resolved.  Echo as above.    Anemia of chronic disease/normocytic anemia -From chronic illnesses.  Hemoglobin stable.   Thickened endometrium and recent vaginal bleeding -Pelvic ultrasound revealed thickened endometrium concerning for neoplasm. ED provider communicated with on-call GYN who recommended outpatient follow-up.  -Patient continues to have mild vaginal bleeding but improving.   -I had reached out to Dr. Ashok Pall via secure chat on 12/06/2022 who responded that his office would make arrangements for outpatient follow-up for this patient.    Diabetes mellitus type 2 -Not on any meds as an outpatient.  Carb modified diet.  A1c 6.1.  Blood sugars controlled.  Outpatient follow-up  Morbid obesity -Outpatient follow-up     Discharge Instructions  Discharge Instructions     Ambulatory referral to Cardiology   Complete by: As directed    Diet - low sodium heart healthy   Complete by: As directed    Diet Carb Modified   Complete by: As directed    Increase activity slowly   Complete by: As directed       Allergies as of 12/07/2022   No Known Allergies      Medication List     STOP taking these medications    hydrochlorothiazide 25 MG tablet Commonly known as: HYDRODIURIL   ondansetron 8 MG disintegrating tablet Commonly known as: ZOFRAN-ODT   PARoxetine 30 MG tablet Commonly known as: PAXIL       TAKE these medications    albuterol 108 (90 Base) MCG/ACT inhaler Commonly known as: VENTOLIN HFA Inhale 2 puffs into the lungs every 6 (six) hours as needed for wheezing or shortness of breath.   alum & mag hydroxide-simeth 200-200-20 MG/5ML suspension Commonly known as: MAALOX/MYLANTA Take 30 mLs by mouth every 6 (six) hours as needed for indigestion or heartburn.   amLODipine 5 MG tablet Commonly known as: NORVASC Take 1 tablet (5 mg total) by mouth daily. Start taking on: Dec 08, 2022   cyclobenzaprine 10 MG tablet Commonly known as: FLEXERIL Take 10 mg by mouth daily as needed for muscle spasms.   dicyclomine 10 MG capsule Commonly known as: BENTYL Take 1 capsule (10 mg total) by mouth 4 (four) times daily -  before meals and at bedtime.   DULoxetine 60 MG capsule Commonly known as: CYMBALTA Take 60 mg by mouth daily.   furosemide 40 MG tablet Commonly known as: Lasix Take 1 tablet (40 mg total) by mouth 2 (two) times daily.   hydrOXYzine 25 MG tablet Commonly known as: ATARAX Take 25 mg by mouth 3 (three) times daily.   LORazepam 1 MG tablet Commonly known as: Ativan Take 1 tablet (1 mg total) by mouth 3 (three) times daily as needed for anxiety. What changed:  when to take this additional instructions   nystatin powder Generic drug: nystatin Apply 1  Application topically 3 (three) times daily as needed (for irritation- affected areas).   ondansetron 4 MG tablet Commonly known as: ZOFRAN Take 1 tablet (4 mg total) by mouth every 8 (eight) hours as needed for nausea or vomiting. What changed:  when to take this reasons to take this   Oxycodone HCl 10 MG Tabs Take 1 tablet (10 mg total) by mouth every 6 (six) hours as needed (moderate pain). What changed:  when to take this reasons to take this additional instructions   pantoprazole 40 MG tablet Commonly known as: Protonix Take 1 tablet (40 mg total) by mouth 2 (two) times daily before a meal for 15 days.   polyethylene glycol 17 g packet Commonly known as: MIRALAX / GLYCOLAX Take 17 g by mouth daily as needed for moderate constipation.   senna-docusate 8.6-50 MG tablet Commonly known as: Senokot-S Take 1 tablet by mouth 2 (two) times daily.   Vitamin D (Ergocalciferol) 1.25 MG (50000 UNIT) Caps capsule Commonly known as: DRISDOL Take 50,000 Units by mouth every Thursday.        Follow-up Information     Maye Hides, [place] .   Specialty: Physician Assistant Why: Maintain your scheduled follow-up appoint with your primary care provider as scheduled for 12/06/2022 Contact information: [address]  [place] [address] [phone]         [facility] Emergency Department at [facility] .   Specialty: Emergency Medicine Why: If symptoms worsen Contact information: [address] [id number] mc [address] [place] [address] [phone]        Molinda Bailiff, [place]. Schedule an appointment as soon as possible for a visit in 1 week(s).   Specialty: Family Medicine Contact information: [address] [place] [address] [phone]         Kathrynn Running, MD. Schedule an appointment as soon as possible for a visit in 1 week(s).   Specialties: Obstetrics and Gynecology, Family Medicine Contact information: [address] [place] [address] [phone]                No Known Allergies  Consultations: Communicated with GYN/Dr. Ashok Pall via secure chat on 12/06/2022   Procedures/Studies: US Abdomen Limited RUQ (LIVER/GB)  Result Date: 12/06/2022 CLINICAL DATA:  [id number] Abdominal pain [id number] EXAM: ULTRASOUND ABDOMEN LIMITED RIGHT UPPER QUADRANT COMPARISON:  CT abdomen pelvis 12/04/2022 FINDINGS: Gallbladder: No gallstones or wall thickening visualized. No sonographic Murphy sign noted by sonographer. Common bile duct: Diameter: 3 mm. Liver: No focal lesion identified. Increased parenchymal echogenicity. Portal vein is patent on color Doppler imaging with normal direction of blood flow towards the liver. Other: None. IMPRESSION: Hepatic steatosis. Please note limited evaluation for focal hepatic masses in a patient with hepatic steatosis due to decreased penetration of the acoustic ultrasound waves. Electronically Signed   By: Tish Frederickson M.D.   On: 12/06/2022 19:18   ECHOCARDIOGRAM COMPLETE  Result Date: 12/05/2022    ECHOCARDIOGRAM REPORT   Patient Name:   Amy Boyle Date of Exam: 12/05/2022 Medical Rec #:  [id number]          Height:       66.0 in Accession #:    [id number]         Weight:       364.2 lb Date of Birth:  16-May-1966           BSA:          2.580 m Patient Age:    57 years           BP:           183/78 mmHg Patient Gender: F                  HR:           69 bpm. Exam Location:  Inpatient Procedure: 2D Echo, Color Doppler, Cardiac Doppler and Intracardiac            Opacification Agent Indications:    Dyspnea  History:        Patient has no prior history of Echocardiogram examinations.                 Signs/Symptoms:Dyspnea; Risk Factors:Diabetes, Morbid Obesity                 and Hypertension.  Sonographer:    Milbert Coulter Referring Phys: [id number] [address]ella Dar  Sonographer Comments: Patient is obese. Image acquisition challenging due to patient body habitus. IMPRESSIONS  1.  Technically difficult study with suboptimal echo windows, Definity image enhancement given  2. Left ventricular ejection fraction, by estimation, is 70 to 75%. Left ventricular ejection fraction by PLAX is 75 %. The left ventricle has hyperdynamic function. The left ventricle has no regional wall  motion abnormalities. There is moderate left ventricular hypertrophy. Left ventricular diastolic parameters are consistent with Grade I diastolic dysfunction (impaired relaxation).  3. Right ventricular systolic function is hyperdynamic. The right ventricular size is normal.  4. The mitral valve was not well visualized. No evidence of mitral valve regurgitation.  5. The aortic valve was not well visualized. Aortic valve regurgitation is not visualized.  6. Increased flow velocities may be secondary to anemia, thyrotoxicosis, hyperdynamic or high flow state. Comparison(s): No prior Echocardiogram. FINDINGS  Left Ventricle: Left ventricular ejection fraction, by estimation, is 70 to 75%. Left ventricular ejection fraction by PLAX is 75 %. The left ventricle has hyperdynamic function. The left ventricle has no regional wall motion abnormalities. Definity contrast agent was given IV to delineate the left ventricular endocardial borders. The left ventricular internal cavity size was normal in size. There is moderate left ventricular hypertrophy. Left ventricular diastolic parameters are consistent with Grade I diastolic dysfunction (impaired relaxation). Indeterminate filling pressures. Right Ventricle: The right ventricular size is normal. No increase in right ventricular wall thickness. Right ventricular systolic function is hyperdynamic. Left Atrium: Left atrial size was normal in size. Right Atrium: Right atrial size was normal in size. Pericardium: There is no evidence of pericardial effusion. Mitral Valve: The mitral valve was not well visualized. No evidence of mitral valve regurgitation. Tricuspid Valve: The tricuspid  valve is grossly normal. Tricuspid valve regurgitation is mild. Aortic Valve: The aortic valve was not well visualized. Aortic valve regurgitation is not visualized. Aortic valve mean gradient measures 9.0 mmHg. Aortic valve peak gradient measures 16.3 mmHg. Aortic valve area, by VTI measures 2.90 cm. Pulmonic Valve: The pulmonic valve was not well visualized. Pulmonic valve regurgitation is not visualized. Aorta: The aortic root and ascending aorta are structurally normal, with no evidence of dilitation. Venous: The inferior vena cava was not well visualized. IAS/Shunts: No atrial level shunt detected by color flow Doppler.  LEFT VENTRICLE PLAX 2D LV EF:         Left            Diastology                ventricular     LV e' medial:    7.18 cm/s                ejection        LV E/e' medial:  13.2                fraction by     LV e' lateral:   9.57 cm/s                PLAX is 75      LV E/e' lateral: 9.9                %. LVIDd:         4.60 cm LVIDs:         2.60 cm LV PW:         1.50 cm LV IVS:        1.50 cm LVOT diam:     2.20 cm LV SV:         130 LV SV Index:   50 LVOT Area:     3.80 cm  RIGHT VENTRICLE RV S prime:     19.80 cm/s TAPSE (M-mode): 2.8 cm LEFT ATRIUM             Index  RIGHT ATRIUM           Index LA diam:        3.80 cm 1.47 cm/m   RA Area:     17.20 cm LA Vol (A2C):   79.1 ml 30.66 ml/m  RA Volume:   50.70 ml  19.65 ml/m LA Vol (A4C):   55.1 ml 21.36 ml/m LA Biplane Vol: 66.9 ml 25.93 ml/m  AORTIC VALVE AV Area (Vmax):    2.48 cm AV Area (Vmean):   2.63 cm AV Area (VTI):     2.90 cm AV Vmax:           202.00 cm/s AV Vmean:          146.000 cm/s AV VTI:            0.449 m AV Peak Grad:      16.3 mmHg AV Mean Grad:      9.0 mmHg LVOT Vmax:         132.00 cm/s LVOT Vmean:        101.000 cm/s LVOT VTI:          0.342 m LVOT/AV VTI ratio: 0.76 MITRAL VALVE               TRICUSPID VALVE MV Area (PHT): 3.21 cm    TR Peak grad:   25.2 mmHg MV Decel Time: 236 msec    TR Vmax:         251.00 cm/s MV E velocity: 95.10 cm/s MV A velocity: 80.00 cm/s  SHUNTS MV E/A ratio:  1.19        Systemic VTI:  0.34 m                            Systemic Diam: 2.20 cm Zoila Shutter MD Electronically signed by Zoila Shutter MD Signature Date/Time: 12/05/2022/11:32:21 AM    Final    CT Angio Chest PE W and/or Wo Contrast  Result Date: 12/04/2022 CLINICAL DATA:  Dyspnea. Elevated D-dimer. Intermediate probability for pulmonary embolism. EXAM: CT ANGIOGRAPHY CHEST WITH CONTRAST TECHNIQUE: Multidetector CT imaging of the chest was performed using the standard protocol during bolus administration of intravenous contrast. Multiplanar CT image reconstructions and MIPs were obtained to evaluate the vascular anatomy. RADIATION DOSE REDUCTION: This exam was performed according to the departmental dose-optimization program which includes automated exposure control, adjustment of the mA and/or kV according to patient size and/or use of iterative reconstruction technique. CONTRAST:  OMNIPAQUE IOHEXOL 350 MG/ML SOLN COMPARISON:  None Available. FINDINGS: Cardiovascular: Satisfactory opacification of pulmonary arteries noted. Technically suboptimal exam due to significant beam hardening artifact from position of patient's left arm. No pulmonary emboli identified. No evidence of thoracic aortic dissection or aneurysm. Mediastinum/Nodes: No masses or pathologically enlarged lymph nodes identified. Lungs/Pleura: No pulmonary mass, infiltrate, or effusion. Upper abdomen: No acute findings. Musculoskeletal: No suspicious bone lesions identified. Review of the MIP images confirms the above findings. IMPRESSION: No evidence of pulmonary embolism or other active disease. Electronically Signed   By: Danae Orleans M.D.   On: 12/04/2022 20:07   US PELVIC COMPLETE WITH TRANSVAGINAL  Result Date: 12/04/2022 CLINICAL DATA:  Endometrial thickening seen on CT. EXAM: TRANSABDOMINAL AND TRANSVAGINAL ULTRASOUND OF PELVIS TECHNIQUE: Both  transabdominal and transvaginal ultrasound examinations of the pelvis were performed. Transabdominal technique was performed for global imaging of the pelvis including uterus, ovaries, adnexal regions, and pelvic cul-de-sac. It was necessary to proceed with endovaginal exam  following the transabdominal exam to visualize the endometrium and ovaries. COMPARISON:  CT abdomen pelvis dated 12/04/2022. FINDINGS: Evaluation is limited due to body habitus. Uterus Measurements: 10.5 x 6.2 x 7.3 cm = volume: 250 mL. No fibroids or other mass visualized. Endometrium Thickness: 3.7 cm. The endometrium is thickened with internal vascularity. Findings may represent endometrial hyperplasia or polyps but concerning for neoplasm. Further evaluation with hysteroscopy is recommended. Right ovary Not visualized. Left ovary Not visualized. Other findings No abnormal free fluid. IMPRESSION: Thickened endometrium concerning for neoplasm. Further evaluation with hysteroscopy is recommended. Electronically Signed   By: Elgie Collard M.D.   On: 12/04/2022 15:48   DG Chest 2 View  Result Date: 12/04/2022 CLINICAL DATA:  Shortness of breath EXAM: CHEST - 2 VIEW COMPARISON:  X-ray 02/24/2021 and older FINDINGS: Enlarged cardiopericardial silhouette. Vascular congestion with trace edema. No pneumothorax or effusion. No consolidation. Degenerative changes of the spine. Overlapping cardiac leads IMPRESSION: Enlarged cardiopericardial silhouette with some vascular congestion. Electronically Signed   By: Karen Kays M.D.   On: 12/04/2022 13:30   CT ABDOMEN PELVIS W CONTRAST  Result Date: 12/04/2022 CLINICAL DATA:  Abdominal pain, acute, nonlocalized EXAM: CT ABDOMEN AND PELVIS WITH CONTRAST TECHNIQUE: Multidetector CT imaging of the abdomen and pelvis was performed using the standard protocol following bolus administration of intravenous contrast. RADIATION DOSE REDUCTION: This exam was performed according to the departmental  dose-optimization program which includes automated exposure control, adjustment of the mA and/or kV according to patient size and/or use of iterative reconstruction technique. CONTRAST:  OMNIPAQUE IOHEXOL 300 MG/ML  SOLN COMPARISON:  08/13/2021 FINDINGS: Technical note: Examination is degraded by beam hardening and photon starvation secondary to patient body habitus. Lower chest: No acute abnormality. Hepatobiliary: Liver measures 23 cm in length. No focal liver lesion is identified. Unremarkable gallbladder. No hyperdense gallstone. No biliary dilatation. Pancreas: Unremarkable. No pancreatic ductal dilatation or surrounding inflammatory changes. Spleen: Normal in size without focal abnormality. Adrenals/Urinary Tract: Adrenal glands are unremarkable. Kidneys are grossly unremarkable without renal calculi, focal lesion, or hydronephrosis. Bladder is unremarkable. Stomach/Bowel: Small hiatal hernia. Stomach is otherwise within normal limits. No evidence of appendicitis. No evidence of bowel wall thickening, distention, or inflammatory changes. Vascular/Lymphatic: No significant vascular findings are present. No enlarged abdominal or pelvic lymph nodes. Reproductive: Anteverted uterus. Abnormally thickened appearance of the endometrial stripe measuring approximately 3.7 cm (series 7, image 78). No adnexal masses. Other: No free fluid. No abdominopelvic fluid collection. No pneumoperitoneum. Small fat containing umbilical hernia. Musculoskeletal: No acute or significant osseous findings. IMPRESSION: 1. No acute abdominopelvic findings. 2. Abnormally thickened appearance of the endometrial stripe. Further evaluation with pelvic ultrasound is recommended. 3. Hepatomegaly. 4. Small hiatal hernia. Electronically Signed   By: Duanne Guess D.O.   On: 12/04/2022 13:27      Subjective: Patient seen and examined at bedside.  Abdominal pain is improving.  Leg swelling is improving.  Feels okay to go home today.   No fever, vomiting or chest pain reported.  Discharge Exam: Vitals:   12/06/22 2037 12/07/22 0549  BP: (!) 166/86 (!) 165/78  Pulse: 77 80  Resp: (!) 8 16  Temp: 98.6 F (37 C) 98.2 F (36.8 C)  SpO2: 97% 96%    General: Pt is alert, awake, not in acute distress.  On room air. Cardiovascular: rate controlled, S1/S2 + Respiratory: bilateral decreased breath sounds at bases with some scattered crackles Abdominal: Soft, morbidly obese, NT, slightly distended, bowel sounds + Extremities: Bilateral lower  extremity edema present; no cyanosis    The results of significant diagnostics from this hospitalization (including imaging, microbiology, ancillary and laboratory) are listed below for reference.     Microbiology: No results found for this or any previous visit (from the past 240 hour(s)).   Labs: BNP (last 3 results) Recent Labs    12/04/22 1113  BNP 47.9   Basic Metabolic Panel: Recent Labs  Lab 12/04/22 1113 12/05/22 0558 12/06/22 0537  NA 135 138 135  K 3.5 3.7 3.5  CL 102 102 100  CO2 20* 23 24  GLUCOSE 138* 122* 129*  BUN 10 10 8   CREATININE 0.80 0.81 0.82  CALCIUM 9.0 9.5 8.7*  MG 2.1 2.1 2.4   Liver Function Tests: Recent Labs  Lab 12/04/22 1113 12/05/22 0558 12/06/22 0537  AST 20 23 24   ALT 14 15 16   ALKPHOS 66 59 54  BILITOT 0.4 0.9 0.8  PROT 8.2* 7.8 7.6  ALBUMIN 4.0 3.7 3.7   Recent Labs  Lab 12/04/22 1113  LIPASE 26   No results for input(s): "AMMONIA" in the last 168 hours. CBC: Recent Labs  Lab 12/04/22 1113 12/05/22 0558  WBC 9.1 10.9*  NEUTROABS  --  8.0*  HGB 10.9* 10.8*  HCT 35.0* 35.1*  MCV 81.6 81.3  PLT 265 222   Cardiac Enzymes: No results for input(s): "CKTOTAL", "CKMB", "CKMBINDEX", "TROPONINI" in the last 168 hours. BNP: Invalid input(s): "POCBNP" CBG: Recent Labs  Lab 12/06/22 0725 12/06/22 1151 12/06/22 1606 12/06/22 2034 12/07/22 0735  GLUCAP 110* 122* 141* 139* 121*   D-Dimer Recent Labs     12/04/22 1810  DDIMER 0.60*   Hgb A1c Recent Labs    12/05/22 0902  HGBA1C 6.1*   Lipid Profile Recent Labs    12/06/22 0537  CHOL 187  HDL 44  LDLCALC 126*  TRIG 85  CHOLHDL 4.3   Thyroid function studies Recent Labs    12/04/22 1553  TSH 1.689   Anemia work up No results for input(s): "VITAMINB12", "FOLATE", "FERRITIN", "TIBC", "IRON", "RETICCTPCT" in the last 72 hours. Urinalysis    Component Value Date/Time   COLORURINE YELLOW 08/14/2021 0120   APPEARANCEUR CLEAR 08/14/2021 0120   LABSPEC 1.010 08/14/2021 0120   PHURINE 6.0 08/14/2021 0120   GLUCOSEU NEGATIVE 08/14/2021 0120   HGBUR TRACE (A) 08/14/2021 0120   BILIRUBINUR NEGATIVE 08/14/2021 0120   KETONESUR 15 (A) 08/14/2021 0120   PROTEINUR 30 (A) 08/14/2021 0120   UROBILINOGEN 1.0 01/11/2011 2325   NITRITE NEGATIVE 08/14/2021 0120   LEUKOCYTESUR NEGATIVE 08/14/2021 0120   Sepsis Labs Recent Labs  Lab 12/04/22 1113 12/05/22 0558  WBC 9.1 10.9*   Microbiology No results found for this or any previous visit (from the past 240 hour(s)).   Time coordinating discharge: 35 minutes  SIGNED:   Glade Lloyd, MD  [facility] 12/07/2022, 10:25 AM

## 2022-12-07 NOTE — Discharge Instructions (Addendum)
You have been seen today for your complaint of abnormal uterine bleeding. Your discharge medications include your home medications. Follow up with: Your primary care provider on Monday as scheduled.   Dr. Despina Hidden, he is a gynecologist.  You should call on Monday to schedule an appointment for a follow-up visit regarding your abnormal uterine bleeding if your primary gynecologist can not get you in within the next week. Please seek immediate medical care if you develop any of the following symptoms: You faint. You have bleeding that soaks through a sanitary pad every hour. You have pain in the abdomen. You have a fever or chills. You become sweaty or weak. You pass large blood clots from your vagina. At this time there does not appear to be the presence of an emergent medical condition, however there is always the potential for conditions to change. Please read and follow the below instructions.  Do not take your medicine if  develop an itchy rash, swelling in your mouth or lips, or difficulty breathing; call 911 and seek immediate emergency medical attention if this occurs.  You may review your lab tests and imaging results in their entirety on your MyChart account.  Please discuss all results of fully with your primary care provider and other specialist at your follow-up visit.  Note: Portions of this text may have been transcribed using voice recognition software. Every effort was made to ensure accuracy; however, inadvertent computerized transcription errors may still be present.

## 2022-12-07 NOTE — ED Triage Notes (Signed)
Discharged from [facility] today. Says she was kept for dyspnea with exertion, generalized abdominal pain, postural hypotension and vaginal bleeding.   Says around 9PM she passed "something" from vagina. She is unsure if it is tissue, a clot, or her bladder. Non visualized object in bag.

## 2022-12-07 NOTE — ED Provider Notes (Signed)
[place] [facility]PART[facility] AT [facility] Provider Note   CSN: [id number] Arrival date & time: 12/07/22  2210     History  Chief Complaint  Patient presents with   Abdominal Pain    Amy Boyle is a 57 y.o. female.  With history of anxiety, diabetes, hypertension, depression who presents to the ED for evaluation of vaginal bleeding and lower abdominal pain.  She was seen here 3 days prior for the same and was admitted for pain control and hypoxia.  During her workup it was noted that she had a thick endometrium concerning for neoplasm.  Gynecology was consulted and recommends outpatient follow-up.  Patient was discharged this morning.  Approximately 1 hour prior to arrival she passed another large blood clot vaginally.  This concerned her due to the size of the clot.  Shortly after passing a clot she developed lower abdominal cramping pain which is similar to her initial presentation.  She took a dose of her home oxycodone and reported improvement in her symptoms, however still has the pain at this time.  She does not have persistent vaginal bleeding.  She denies urinary symptoms, chest pain, shortness of breath, dizziness.  She does have some lightheadedness which has not changed since prior to her admission.   Abdominal Pain Associated symptoms: vaginal bleeding        Home Medications Prior to Admission medications   Medication Sig Start Date End Date Taking? Authorizing Provider  albuterol (VENTOLIN HFA) 108 (90 Base) MCG/ACT inhaler Inhale 2 puffs into the lungs every 6 (six) hours as needed for wheezing or shortness of breath. 04/29/19   [provider]  alum & mag hydroxide-simeth (MAALOX/MYLANTA) 200-200-20 MG/5ML suspension Take 30 mLs by mouth every 6 (six) hours as needed for indigestion or heartburn. 12/07/22   Glade Lloyd, MD  amLODipine (NORVASC) 5 MG tablet Take 1 tablet (5 mg total) by mouth daily. 12/08/22   Glade Lloyd, MD   cyclobenzaprine (FLEXERIL) 10 MG tablet Take 10 mg by mouth daily as needed for muscle spasms. 10/10/22   [provider]  dicyclomine (BENTYL) 10 MG capsule Take 1 capsule (10 mg total) by mouth 4 (four) times daily -  before meals and at bedtime. 12/07/22   Glade Lloyd, MD  DULoxetine (CYMBALTA) 60 MG capsule Take 60 mg by mouth daily. 10/08/22   [provider]  furosemide (LASIX) 40 MG tablet Take 1 tablet (40 mg total) by mouth 2 (two) times daily. 12/07/22 12/07/23  Glade Lloyd, MD  hydrOXYzine (ATARAX) 25 MG tablet Take 25 mg by mouth 3 (three) times daily. 10/10/22   [provider]  LORazepam (ATIVAN) 1 MG tablet Take 1 tablet (1 mg total) by mouth 3 (three) times daily as needed for anxiety. Patient taking differently: Take 1 mg by mouth See admin instructions. Take 1 tablet by mouth in the morning and afternoon 02/14/21   Molpus, Jonny Ruiz, MD  NYSTATIN powder Apply 1 Application topically 3 (three) times daily as needed (for irritation- affected areas). 10/10/22   [provider]  ondansetron (ZOFRAN) 4 MG tablet Take 1 tablet (4 mg total) by mouth every 8 (eight) hours as needed for nausea or vomiting. 12/07/22   Glade Lloyd, MD  Oxycodone HCl 10 MG TABS Take 1 tablet (10 mg total) by mouth every 6 (six) hours as needed (moderate pain). 12/07/22   Glade Lloyd, MD  pantoprazole (PROTONIX) 40 MG tablet Take 1 tablet (40 mg total) by mouth 2 (two)  times daily before a meal for 15 days. 12/07/22 12/22/22  Glade Lloyd, MD  polyethylene glycol (MIRALAX / GLYCOLAX) 17 g packet Take 17 g by mouth daily as needed for moderate constipation. 12/07/22   Glade Lloyd, MD  senna-docusate (SENOKOT-S) 8.6-50 MG tablet Take 1 tablet by mouth 2 (two) times daily. 12/07/22   Glade Lloyd, MD  Vitamin D, Ergocalciferol, (DRISDOL) 1.25 MG (50000 UNIT) CAPS capsule Take 50,000 Units by mouth every Thursday. 10/10/22   [provider]      Allergies    Patient has no known  allergies.    Review of Systems   Review of Systems  Gastrointestinal:  Positive for abdominal pain.  Genitourinary:  Positive for vaginal bleeding.  All other systems reviewed and are negative.   Physical Exam Updated Vital Signs BP 138/86   Pulse 93   Temp 98.2 F (36.8 C)   Resp (!) 22   Ht 5\' 6"  (1.676 m)   Wt (!) 165.6 kg   SpO2 96%   BMI 58.91 kg/m  Physical Exam Vitals and nursing note reviewed. Exam conducted with a chaperone present Automotive engineer, RN).  Constitutional:      General: She is not in acute distress.    Appearance: She is well-developed.  HENT:     Head: Normocephalic and atraumatic.  Eyes:     Conjunctiva/sclera: Conjunctivae normal.  Cardiovascular:     Rate and Rhythm: Normal rate and regular rhythm.     Heart sounds: No murmur heard. Pulmonary:     Effort: Pulmonary effort is normal. No respiratory distress.     Breath sounds: Normal breath sounds.  Abdominal:     Palpations: Abdomen is soft.     Tenderness: There is abdominal tenderness in the right lower quadrant and left lower quadrant.  Genitourinary:    Exam position: Lithotomy position.     Comments: Small recently passed blood clot.  No active vaginal bleeding. Musculoskeletal:        General: No swelling.     Cervical back: Neck supple.  Skin:    General: Skin is warm and dry.     Capillary Refill: Capillary refill takes less than 2 seconds.  Neurological:     Mental Status: She is alert.  Psychiatric:        Mood and Affect: Mood normal.     ED Results / Procedures / Treatments   Labs (all labs ordered are listed, but only abnormal results are displayed) Labs Reviewed - No data to display  EKG None  Radiology US Abdomen Limited RUQ (LIVER/GB)  Result Date: 12/06/2022 CLINICAL DATA:  [id number] Abdominal pain [id number] EXAM: ULTRASOUND ABDOMEN LIMITED RIGHT UPPER QUADRANT COMPARISON:  CT abdomen pelvis 12/04/2022 FINDINGS: Gallbladder: No gallstones or wall thickening visualized.  No sonographic Murphy sign noted by sonographer. Common bile duct: Diameter: 3 mm. Liver: No focal lesion identified. Increased parenchymal echogenicity. Portal vein is patent on color Doppler imaging with normal direction of blood flow towards the liver. Other: None. IMPRESSION: Hepatic steatosis. Please note limited evaluation for focal hepatic masses in a patient with hepatic steatosis due to decreased penetration of the acoustic ultrasound waves. Electronically Signed   By: Tish Frederickson M.D.   On: 12/06/2022 19:18    Procedures Procedures    Medications Ordered in ED Medications  oxyCODONE (Oxy IR/ROXICODONE) immediate release tablet 10 mg (10 mg Oral Given 12/07/22 2249)    ED Course/ Medical Decision Making/ A&P  Medical Decision Making Risk Prescription drug management.  This patient presents to the ED for concern of vaginal bleeding, lower abdominal pain, this involves an extensive number of treatment options, and is a complaint that carries with it a high risk of complications and morbidity.  The differential diagnosis includes endometritis, myometrium, fibroids, adenoma  Co morbidities that complicate the patient evaluation  anxiety, diabetes, hypertension, depression  My initial workup includes pain control  Additional history obtained from: Nursing notes from this visit. Previous records within EMR system ED to hospital admission on 12/04/2022 for similar  Afebrile, hemodynamically stable.  57 year old female presenting to the ED for evaluation of lower abdominal pain and vaginal bleeding.  Was discharged from the hospital today for same.  She has a another blood clot approximately 1 hour prior to arrival which concerned her due to the size.  She did have worsening of her pain after she passes blood clot.  She took her home pain medication with improvement in her symptoms, however still has some pain.  She had a very extensive workup while in the  emergency department and while admitted which showed concern for possible neoplasm of the endometrium.  She is to follow-up with gynecology regarding this for possible hysteroscopy.  This is likely the cause of her abnormal uterine bleeding.  She has an appointment with her primary care provider on Monday and she also plans to schedule an appointment with gynecology on Monday.  Her physical exam is significant for a small blood clot passed vaginally but no active vaginal hemorrhage.  She was given a dose of pain medication in the ED.  She has no new symptoms of anemia.  Given her very recent large workup, do not believe she needs more of a workup tonight.  She was encouraged to follow-up as scheduled.  She is in agreement with this plan.  She was given return precautions.  Stable at discharge.  At this time there does not appear to be any evidence of an acute emergency medical condition and the patient appears stable for discharge with appropriate outpatient follow up. Diagnosis was discussed with patient who verbalizes understanding of care plan and is agreeable to discharge. I have discussed return precautions with patient and daughter who verbalizes understanding. Patient encouraged to follow-up with their PCP on Monday as scheduled, and gynecology as soon as possible. All questions answered.  Patient's case discussed with Dr. Silverio Lay who agrees with plan to discharge with follow-up.   Note: Portions of this report may have been transcribed using voice recognition software. Every effort was made to ensure accuracy; however, inadvertent computerized transcription errors may still be present.         Final Clinical Impression(s) / ED Diagnoses Final diagnoses:  Abnormal uterine bleeding    Rx / DC Orders ED Discharge Orders     None         Mora Bellman 12/07/22 2309    Charlynne Pander, MD 12/07/22 [phone]

## 2022-12-09 ENCOUNTER — Other Ambulatory Visit: Payer: Self-pay | Admitting: Nurse Practitioner

## 2022-12-09 MED ORDER — OMEPRAZOLE MAGNESIUM 20 MG PO TBEC: 20.0000 mg | DELAYED_RELEASE_TABLET | Freq: Every day | ORAL | 1 refills | Status: AC

## 2022-12-09 NOTE — Progress Notes (Signed)
Prescription PPI not covered- substituted OTC Omeprazole

## 2022-12-11 ENCOUNTER — Telehealth: Payer: Self-pay

## 2022-12-11 NOTE — Telephone Encounter (Signed)
Received a message about scheduling a follow up appointment for this patient after her ED visit. She stated that she is following up with [facility] this afternoon at 1pm and does not need an appointment with [place].

## 2022-12-19 DIAGNOSIS — N926 Irregular menstruation, unspecified: Secondary | ICD-10-CM | POA: Insufficient documentation

## 2022-12-19 DIAGNOSIS — N950 Postmenopausal bleeding: Secondary | ICD-10-CM | POA: Insufficient documentation

## 2023-03-05 DIAGNOSIS — Z975 Presence of (intrauterine) contraceptive device: Secondary | ICD-10-CM | POA: Insufficient documentation

## 2023-03-11 ENCOUNTER — Ambulatory Visit: Payer: No Typology Code available for payment source | Attending: Cardiology | Admitting: Cardiology
